# Patient Record
Sex: Male | Born: 1965 | Race: White | Hispanic: No | Marital: Single | Smoking: Former smoker
Health system: Southern US, Community
[De-identification: ages and names within clinical notes are randomized; demographics above are authoritative.]

## PROBLEM LIST (undated history)

## (undated) DIAGNOSIS — F329 Major depressive disorder, single episode, unspecified: Secondary | ICD-10-CM

## (undated) DIAGNOSIS — F32A Depression, unspecified: Secondary | ICD-10-CM

## (undated) DIAGNOSIS — N189 Chronic kidney disease, unspecified: Secondary | ICD-10-CM

## (undated) HISTORY — PX: OTHER SURGICAL HISTORY: SHX169

---

## 2011-08-26 ENCOUNTER — Ambulatory Visit: Payer: Self-pay | Admitting: Internal Medicine

## 2012-01-30 ENCOUNTER — Emergency Department: Payer: Self-pay | Admitting: Emergency Medicine

## 2012-01-30 LAB — CBC
HCT: 45.3 % (ref 40.0–52.0)
HGB: 15.3 g/dL (ref 13.0–18.0)
MCH: 30.4 pg (ref 26.0–34.0)
MCHC: 33.7 g/dL (ref 32.0–36.0)
MCV: 90 fL (ref 80–100)
Platelet: 305 10*3/uL (ref 150–440)
RBC: 5.02 10*6/uL (ref 4.40–5.90)
RDW: 13.4 % (ref 11.5–14.5)
WBC: 15.2 10*3/uL — ABNORMAL HIGH (ref 3.8–10.6)

## 2012-01-30 LAB — URINALYSIS, COMPLETE
Bacteria: NONE SEEN
Bilirubin,UR: NEGATIVE
Glucose,UR: NEGATIVE mg/dL (ref 0–75)
Leukocyte Esterase: NEGATIVE
Nitrite: NEGATIVE
Specific Gravity: 1.019 (ref 1.003–1.030)
Squamous Epithelial: NONE SEEN
WBC UR: 1 /HPF (ref 0–5)

## 2012-01-30 LAB — COMPREHENSIVE METABOLIC PANEL
Albumin: 3.8 g/dL (ref 3.4–5.0)
Alkaline Phosphatase: 76 U/L (ref 50–136)
BUN: 13 mg/dL (ref 7–18)
Calcium, Total: 9.1 mg/dL (ref 8.5–10.1)
Creatinine: 1.45 mg/dL — ABNORMAL HIGH (ref 0.60–1.30)
Glucose: 133 mg/dL — ABNORMAL HIGH (ref 65–99)
SGOT(AST): 25 U/L (ref 15–37)
SGPT (ALT): 36 U/L
Sodium: 140 mmol/L (ref 136–145)
Total Protein: 7.7 g/dL (ref 6.4–8.2)

## 2012-02-04 ENCOUNTER — Other Ambulatory Visit: Payer: Self-pay | Admitting: Urology

## 2012-02-09 ENCOUNTER — Encounter (HOSPITAL_COMMUNITY): Payer: Self-pay | Admitting: *Deleted

## 2012-02-09 NOTE — Pre-Procedure Instructions (Signed)
Patient instructed to bring blue folder, driver, insurance info, picture ID. Patient to follow instructions for laxative,no asa, ibuprofen, toradol 72 hours prior to litho, NPO after midnight,except meds with a sip. Patient to arrive at 0530 am.

## 2012-02-11 ENCOUNTER — Encounter (HOSPITAL_COMMUNITY): Payer: Self-pay | Admitting: Pharmacy Technician

## 2012-02-12 ENCOUNTER — Ambulatory Visit (HOSPITAL_COMMUNITY): Payer: BC Managed Care – PPO

## 2012-02-12 ENCOUNTER — Encounter (HOSPITAL_COMMUNITY): Payer: Self-pay

## 2012-02-12 ENCOUNTER — Encounter (HOSPITAL_COMMUNITY)
Admission: RE | Disposition: A | Discharge status: Home / Self Care | Payer: Self-pay | Source: Ambulatory Visit | Attending: Urology

## 2012-02-12 ENCOUNTER — Ambulatory Visit (HOSPITAL_COMMUNITY)
Admission: RE | Admit: 2012-02-12 | Discharge: 2012-02-12 | Disposition: A | Discharge status: Home / Self Care | Payer: BC Managed Care – PPO | Source: Ambulatory Visit | Attending: Urology | Admitting: Urology

## 2012-02-12 DIAGNOSIS — G43909 Migraine, unspecified, not intractable, without status migrainosus: Secondary | ICD-10-CM | POA: Insufficient documentation

## 2012-02-12 DIAGNOSIS — F329 Major depressive disorder, single episode, unspecified: Secondary | ICD-10-CM | POA: Insufficient documentation

## 2012-02-12 DIAGNOSIS — F3289 Other specified depressive episodes: Secondary | ICD-10-CM | POA: Insufficient documentation

## 2012-02-12 DIAGNOSIS — N201 Calculus of ureter: Secondary | ICD-10-CM | POA: Insufficient documentation

## 2012-02-12 HISTORY — DX: Depression, unspecified: F32.A

## 2012-02-12 HISTORY — DX: Chronic kidney disease, unspecified: N18.9

## 2012-02-12 HISTORY — DX: Major depressive disorder, single episode, unspecified: F32.9

## 2012-02-12 SURGERY — LITHOTRIPSY, ESWL
Anesthesia: LOCAL | Laterality: Left

## 2012-02-12 MED ORDER — OXYCODONE-ACETAMINOPHEN 5-325 MG PO TABS
ORAL_TABLET | ORAL | Status: AC
  Filled 2012-02-12: qty 1

## 2012-02-12 MED ORDER — OXYCODONE-ACETAMINOPHEN 5-325 MG PO TABS: 1.0000 | ORAL_TABLET | ORAL | Status: DC | PRN

## 2012-02-12 MED ORDER — CIPROFLOXACIN HCL 500 MG PO TABS
500.0000 mg | ORAL_TABLET | ORAL | Status: AC
Start: 2012-02-12 — End: 2012-02-12
  Administered 2012-02-12: 500 mg via ORAL

## 2012-02-12 MED ORDER — DIAZEPAM 5 MG PO TABS
ORAL_TABLET | ORAL | Status: AC
  Administered 2012-02-12: 10 mg via ORAL
  Filled 2012-02-12: qty 2

## 2012-02-12 MED ORDER — DIPHENHYDRAMINE HCL 25 MG PO CAPS
ORAL_CAPSULE | ORAL | Status: AC
  Administered 2012-02-12: 25 mg via ORAL
  Filled 2012-02-12: qty 1

## 2012-02-12 MED ORDER — SODIUM CHLORIDE 0.9 % IV SOLN
INTRAVENOUS | Status: DC
  Administered 2012-02-12: 07:00:00 via INTRAVENOUS

## 2012-02-12 MED ORDER — DIPHENHYDRAMINE HCL 25 MG PO CAPS
25.0000 mg | ORAL_CAPSULE | ORAL | Status: AC
  Administered 2012-02-12: 25 mg via ORAL

## 2012-02-12 MED ORDER — OXYCODONE-ACETAMINOPHEN 10-325 MG PO TABS: 1.0000 | ORAL_TABLET | ORAL | Status: AC | PRN

## 2012-02-12 MED ORDER — HYDROMORPHONE HCL PF 1 MG/ML IJ SOLN: 0.5000 mg | INTRAMUSCULAR | Status: DC | PRN

## 2012-02-12 MED ORDER — DIAZEPAM 5 MG PO TABS
10.0000 mg | ORAL_TABLET | ORAL | Status: AC
  Administered 2012-02-12: 10 mg via ORAL

## 2012-02-12 MED ORDER — CIPROFLOXACIN HCL 500 MG PO TABS
ORAL_TABLET | ORAL | Status: AC
  Administered 2012-02-12: 500 mg via ORAL
  Filled 2012-02-12: qty 1

## 2012-02-12 NOTE — Progress Notes (Signed)
To litho truck via w/c 

## 2012-02-12 NOTE — Discharge Instructions (Signed)
Lithotripsy Care After Refer to this sheet for the next few weeks. These discharge instructions provide you with general information on caring for yourself after you leave the hospital. Your caregiver may also give you specific instructions. Your treatment has been planned according to the most current medical practices available, but unavoidable complications sometimes occur. If you have any problems or questions after discharge, please call your caregiver. AFTER THE PROCEDURE   The recovery time will vary with the procedure done.   You will be taken to the recovery area. A nurse will watch and check your progress. Once you are awake, stable, and taking fluids well, you will be allowed to go home as long as there are no problems.   Your urine may have a red tinge for a few days after treatment. Blood loss is usually minimal.   You may have soreness in the back or flank area. This usually goes away after a few days. The procedure can cause blotches or bruises on the back where the pressure wave enters the skin. These marks usually cause only minimal discomfort and should disappear in a short time.   Stone fragments should begin to pass within 24 hours of treatment. However, a delayed passage is not unusual.   You may have pain, discomfort, and feel sick to your stomach (nauseous) when the crushed (pulverized) fragments of stone are passed down the tube from the kidney to the bladder. Stone fragments can pass soon after the procedure and may last for up to 4 to 8 weeks.   A small number of patients may have severe pain when stone fragments are not able to pass, which leads to an obstruction.   If your stone is greater than 1 inch/2.5 centimeters in diameter or if you have multiple stones that have a combined diameter greater than 1 inch/2.5 centimeters, you may require more than 1 treatment.   You must have someone drive you home.  HOME CARE INSTRUCTIONS   Rest at home until you feel your  energy improving.   Only take over-the-counter or prescription medicines for pain, discomfort, or fever as directed by your caregiver. Depending on the type of lithotripsy, you may need to take medicines (antibiotics) that kill germs and anti-inflammatory medicines for a few days.   Drink enough water and fluids to keep your urine clear or pale yellow. This helps "flush" your kidneys. It helps pass any remaining pieces of stone and prevents stones from coming back.   Most people can resume daily activities within 1 or 2 days after standard lithotripsy. It can take longer to recover from laser and percutaneous lithotripsy.   If the stones are in your urinary system, you may be asked to strain your urine at home to look for stones. Any stones that are found can be sent to a medical lab for examination.   Visit your caregiver for a follow-up appointment in a few weeks. Your doctor may remove your stent if you have one. Your caregiver will also check to see whether stone particles still remain.  SEEK MEDICAL CARE IF:   You have an oral temperature above 102 F (38.9 C).   Your pain is not relieved by medicine.   You have a lasting nauseous feeling.   You feel there is too much blood in the urine.   You develop persistent problems with frequent and/or painful urination that does not at least partially improve after 2 days following the procedure.   You have a congested   cough.   You feel lightheaded.   You develop a rash or any other signs that might suggest an allergic problem.   You develop any reaction or side effects to your medicine(s).  SEEK IMMEDIATE MEDICAL CARE IF:   You experience severe back and/or flank pain.   You see nothing but blood when you urinate.   You cannot pass any urine at all.   You have an oral temperature above 102 F (38.9 C), not controlled by medicine.   You develop shortness of breath, difficulty breathing, or chest pain.   You develop vomiting  that will not stop after 6 to 8 hours.   You have a fainting episode.  MAKE SURE YOU:   Understand these instructions.   Will watch your condition.   Will get help right away if you are not doing well or get worse.  Document Released: 12/07/2007 Document Revised: 11/06/2011 Document Reviewed: 12/07/2007 Exit[employer] Patient Information 2012 [employer]. 

## 2012-02-12 NOTE — H&P (Signed)
History of Present Illness     Mr. Logan Erickson is a 46 year old male patient of Lonie Peak seen for treatment of a left ureteral stone. The patient had reported passing about 2 stones per year and on a CT scan done on 01/26/12 a 5 mm stone was noted in the left proximal ureter. Of note no renal calculi were identified on that study. He started having left flank pain in 1/13. He's had intermittent pain in his been quite severe at times having necessitated a trip to the emergency room. He's not having any pain at this time. He did have some pain last night. He's not seen any hematuria and has no irritative voiding symptoms. His pain was not modified by positional change.  Past urologic history: In addition to the passage of kidney stones he also has had a vasectomy by Dr. Logan Bores in 6/07.   Past Medical History Problems  1. History of  Depression 311 2. History of  Hyperlipidemia 272.4 3. History of  Migraine Headache 346.90  Surgical History  Problems  1. History of  Closed Treatment Of Fracture Of A Finger Joint 2. History of  Pilonidal Cyst Resection  Current Meds 1. Atenolol 25 MG Oral Tablet; Therapy: (Recorded:06Mar2013) to 2. Percocet TABS; Therapy: (Recorded:06Mar2013) to 3. Tamsulosin HCl CAPS; Therapy: (Recorded:06Mar2013) to 4. Toradol SOLN; Therapy: (Recorded:06Mar2013) to 5. Venlafaxine HCl ER 75 MG Oral Capsule Extended Release 24 Hour; Therapy:  (Recorded:06Mar2013) to  Allergies Medication  1. Erythromycin TABS  Family History Problems  1. Paternal history of  Cholelithiasis 2. Maternal history of  Cholelithiasis 3. Sororal history of  Cholelithiasis 4. Family history of  Family Health Status - Father's Age 82 5. Family history of  Family Health Status - Mother's Age 98 6. Paternal uncle's history of  Kidney Cancer V16.51 7. Maternal history of  Migraine Headache 8. Maternal history of  Parkinson's Disease  Social History Problems    Alcohol Use 2-3 Beers  weekly   Caffeine Use 1-2 daily   Former Smoker V15.82 Smoked 1-2 pipes daily; Smoked for 1 year; Quit smoking 21 years ago; Denies any other forms of tobacco use   Marital History - Currently Married   Occupation: Education officer, environmental  Review of Systems Genitourinary, constitutional, skin, eye, otolaryngeal, hematologic/lymphatic, cardiovascular, pulmonary, endocrine, musculoskeletal, gastrointestinal, neurological and psychiatric system(s) were reviewed and pertinent findings if present are noted.  Genitourinary: urinary frequency, dysuria, nocturia, difficulty starting the urinary stream and hematuria.  Gastrointestinal: nausea, abdominal pain and constipation.  Constitutional: feeling tired (fatigue).  Musculoskeletal: back pain.  Neurological: headache and dizziness.  Psychiatric: depression.    Vitals Vital Signs  BMI Calculated: 32.06 BSA Calculated: 2.23 Height: 5 ft 11 in Weight: 229 lb  Blood Pressure: 123 / 82, RUE, Sitting Temperature: 99.8 F, Oral Heart Rate: 67  Physical Exam Constitutional: Well nourished and well developed . No acute distress.  ENT:. The ears and nose are normal in appearance.  Neck: The appearance of the neck is normal and no neck mass is present.  Pulmonary: No respiratory distress and normal respiratory rhythm and effort.  Cardiovascular: Heart rate and rhythm are normal . No peripheral edema.  Abdomen: The abdomen is soft and nontender. No masses are palpated. No CVA tenderness. No hernias are palpable. No hepatosplenomegaly noted.  Lymphatics: The femoral and inguinal nodes are not enlarged or tender.  Skin: Normal skin turgor, no visible rash and no visible skin lesions.  Neuro/Psych:. Mood and affect are appropriate.    Results/Data  Urine   06Mar2013  COLOR YELLOW   APPEARANCE CLOUDY   SPECIFIC GRAVITY 1.010   pH 5.5   GLUCOSE NEG mg/dL  BILIRUBIN NEG   KETONE NEG mg/dL  BLOOD LARGE   PROTEIN NEG mg/dL  UROBILINOGEN  0.2 mg/dL  NITRITE NEG   LEUKOCYTE ESTERASE NEG   SQUAMOUS EPITHELIAL/HPF NONE SEEN   WBC NONE SEEN WBC/hpf  RBC 21-50 RBC/hpf  BACTERIA NONE SEEN   CRYSTALS NONE SEEN   CASTS NONE SEEN    Old records or history reviewed: Notes from [facility] office as above.  The following clinical lab reports were reviewed:  I note his serum creatinine was 1.45 in 3/13.  The following radiology reports were reviewed: CT scan as above.    Assessment Assessed  1. Ureteral Stone Left 592.1   I obtained a KUB today and this reveals an irregular calcification in the distal left pelvis region overlying the course of the left ureter next a phlebolith. I told him that I could not be 100% sure that there was a stone but I was almost certain this was the stone that was previously seen in his proximal ureter about a week ago. He's been on medical expulsive therapy since that time and was pleased to see the stone had progressed but has been having intermittent pain. We therefore discussed the treatment options which would be continued observation with medical expulsive therapy, ureteroscopy and lithotripsy. Gone over each of these options, their pluses and minuses in the risks and complications associated. What is elected to do is proceed with lithotripsy in the next week or so and if he passes his stone in the interim, since it has progressed significantly since his CT scan, he will contact me and we'll cancel the surgery otherwise I will plan to proceed with lithotripsy of his left distal ureteral stone.   Plan   1. Continue medical expulsive therapy. 2. He is scheduled for lithotripsy of his left distal ureteral stone.

## 2012-02-12 NOTE — Op Note (Signed)
See [facility] OP note scanned into chart. 

## 2012-02-13 ENCOUNTER — Encounter (HOSPITAL_COMMUNITY): Payer: Self-pay

## 2020-01-20 ENCOUNTER — Ambulatory Visit: Payer: BC Managed Care – PPO | Attending: Internal Medicine

## 2020-01-20 DIAGNOSIS — Z23 Encounter for immunization: Secondary | ICD-10-CM

## 2020-01-20 NOTE — Progress Notes (Signed)
   Covid-19 Vaccination Clinic  Name:  [name]    MRN: [id number] DOB: 09-01-1966  01/20/2020  [name] was observed post Covid-19 immunization for 15 minutes without incidence. He was provided with Vaccine Information Sheet and instruction to access the V-Safe system.   [name] was instructed to call 911 with any severe reactions post vaccine: Marland Kitchen Difficulty breathing  . Swelling of your face and throat  . A fast heartbeat  . A bad rash all over your body  . Dizziness and weakness    Immunizations Administered    Name Date Dose VIS Date Route   Pfizer COVID-19 Vaccine 01/20/2020  5:16 PM 0.3 mL 11/11/2019 Intramuscular   Manufacturer: [employer], [employer]   Lot: [id number]   NDC: [address]-5

## 2020-02-14 ENCOUNTER — Ambulatory Visit: Payer: BC Managed Care – PPO | Attending: Internal Medicine

## 2020-02-14 DIAGNOSIS — Z23 Encounter for immunization: Secondary | ICD-10-CM

## 2020-02-14 NOTE — Progress Notes (Signed)
   Covid-19 Vaccination Clinic  Name:  GEDALIA MCMILLON    MRN: [id number] DOB: May 17, 1966  02/14/2020  Mr. Lundblad was observed post Covid-19 immunization for 15 minutes without incident. He was provided with Vaccine Information Sheet and instruction to access the V-Safe system.   Mr. Weare was instructed to call 911 with any severe reactions post vaccine: Marland Kitchen Difficulty breathing  . Swelling of face and throat  . A fast heartbeat  . A bad rash all over body  . Dizziness and weakness   Immunizations Administered    Name Date Dose VIS Date Route   Pfizer COVID-19 Vaccine 02/14/2020  8:35 AM 0.3 mL 11/11/2019 Intramuscular   Manufacturer: [employer], [employer]   Lot: [id number]   NDC: [address]-8

## 2022-01-23 ENCOUNTER — Other Ambulatory Visit: Payer: Self-pay

## 2022-01-23 ENCOUNTER — Ambulatory Visit
Admission: RE | Admit: 2022-01-23 | Discharge: 2022-01-23 | Disposition: A | Discharge status: Home / Self Care | Payer: BC Managed Care – PPO | Attending: Urology | Admitting: Urology

## 2022-01-23 ENCOUNTER — Encounter: Payer: Self-pay | Admitting: Urology

## 2022-01-23 ENCOUNTER — Ambulatory Visit
Admission: RE | Admit: 2022-01-23 | Discharge: 2022-01-23 | Disposition: A | Discharge status: Home / Self Care | Payer: BC Managed Care – PPO | Source: Ambulatory Visit | Attending: Urology | Admitting: Urology

## 2022-01-23 ENCOUNTER — Ambulatory Visit: Payer: BC Managed Care – PPO | Admitting: Urology

## 2022-01-23 VITALS — BP 146/92 | HR 130 | Ht 69.0 in | Wt 239.0 lb

## 2022-01-23 DIAGNOSIS — N201 Calculus of ureter: Secondary | ICD-10-CM

## 2022-01-23 DIAGNOSIS — N23 Unspecified renal colic: Secondary | ICD-10-CM

## 2022-01-23 DIAGNOSIS — N200 Calculus of kidney: Secondary | ICD-10-CM

## 2022-01-23 LAB — URINALYSIS, COMPLETE
Bilirubin, UA: NEGATIVE
Glucose, UA: NEGATIVE
Ketones, UA: NEGATIVE
Nitrite, UA: NEGATIVE
Protein,UA: NEGATIVE
Specific Gravity, UA: 1.02 (ref 1.005–1.030)
Urobilinogen, Ur: 0.2 mg/dL (ref 0.2–1.0)
pH, UA: 5.5 (ref 5.0–7.5)

## 2022-01-23 LAB — MICROSCOPIC EXAMINATION
Bacteria, UA: NONE SEEN
Epithelial Cells (non renal): NONE SEEN /hpf (ref 0–10)

## 2022-01-23 NOTE — Progress Notes (Signed)
01/23/2022 2:40 PM   Logan Erickson June 05, 1966 [id number]  Referring provider: Lonie Peak, PA-C [address] [place],  [place] [address]  Chief Complaint  Patient presents with   Nephrolithiasis    HPI: Logan Erickson is a 56 y.o. male referred for evaluation of left flank pain.  2 week history of intermittent left flank pain.  At times pain severe rated 9-9.5/10 and associated with nausea/vomiting.  Urine initially brownish.  Saw PCP and UA showed 3-5 WBC/5-10 RBC.  Urine culture was negative. Started on hydrocodone, ketorolac and tamsulosin Pain minimal at present No fever or chills No imaging yet performed Current symptoms identical to previous stone in 2013.  He had a 6 mm left proximal ureteral calculus and was treated with SWL by Dr. Vernie Ammons in [place] States he passes 1-2 small stones per year  PMH: Past Medical History:  Diagnosis Date   Depression    kidney stone    Migraine     Surgical History: Past Surgical History:  Procedure Laterality Date   reconsturctive surgery right thumb    Shockwave lithotripsy  Home Medications:  Allergies as of 01/23/2022       Reactions   Ciprofloxacin Other (See Comments)   tendinitis   Erythromycin Rash        Medication List        Accurate as of January 23, 2022  2:40 PM. If you have any questions, ask your nurse or doctor.          STOP taking these medications    atenolol 25 MG tablet Commonly known as: TENORMIN Stopped by: Riki Altes, MD   ketorolac 10 MG tablet Commonly known as: TORADOL Stopped by: Riki Altes, MD   latanoprost 0.005 % ophthalmic solution Commonly known as: XALATAN Stopped by: Riki Altes, MD   oxyCODONE-acetaminophen 5-325 MG tablet Commonly known as: PERCOCET/ROXICET Stopped by: Riki Altes, MD       TAKE these medications    buPROPion 150 MG 24 hr tablet Commonly known as: WELLBUTRIN XL Take 150 mg by mouth every morning.    LORazepam 0.5 MG tablet Commonly known as: ATIVAN Take 0.5 mg by mouth 2 (two) times daily as needed.   metoprolol succinate 100 MG 24 hr tablet Commonly known as: TOPROL-XL Take 1 tablet by mouth daily.   tamsulosin 0.4 MG Caps capsule Commonly known as: FLOMAX Take 0.4 mg by mouth daily after breakfast.   venlafaxine 75 MG tablet Commonly known as: EFFEXOR Take by mouth.        Allergies:  Allergies  Allergen Reactions   Ciprofloxacin Other (See Comments)    tendinitis   Erythromycin Rash    Family History: History reviewed. No pertinent family history.  Social History:  reports that he quit smoking about 32 years ago. His smoking use included cigarettes. He has a 0.25 pack-year smoking history. He does not have any smokeless tobacco history on file. He reports current alcohol use. He reports that he does not use drugs.   Physical Exam: BP (!) 146/92    Pulse (!) 130    Ht 5\' 9"  (1.753 m)    Wt 239 lb (108.4 kg)    BMI 35.29 kg/m   Constitutional:  Alert and oriented, No acute distress. HEENT: [place] AT, moist mucus membranes.  Trachea midline, no masses. Skin: No rashes, bruises or suspicious lesions. Neurologic: Grossly intact, no focal deficits, moving all 4 extremities. Psychiatric: Normal mood and affect.  Laboratory Data:  Urinalysis Dipstick trace blood/trace leukocytes/microscopy negative   Assessment & Plan:    1.  Renal colic Symptoms consistent with recurrent ureteral calculus KUB ordered today and he will be notified with results If a definite stone is not visualized will order stone protocol CT Continue tamsulosin and oral analgesics as needed  I contacted Logan Erickson with his KUB results.  He does have a 9 mm faint calcification in the region of the left proximal ureter He has had prior successful lithotripsy and the stone would be amenable to SWL.  Ureteroscopic removal was also discussed SWL has a lower stone free rate in a single procedure,  but also a lower complication rate compared to ureteroscopy and avoids a stent and associated stent related symptoms. Possible complications include renal hematoma, steinstrasse, and need for additional treatment. Ureteroscopy with laser lithotripsy and stent placement has a higher stone free rate than SWL in a single procedure, however increased complication rate including possible infection, ureteral injury, bleeding, and stent related morbidity. Common stent related symptoms include dysuria, urgency/frequency, and flank pain. He would like to schedule SWL next week Schedule RUS to verify   Abbie Sons, MD  [facility] [address], [place], [place] [address] [phone]

## 2022-01-23 NOTE — H&P (View-Only) (Signed)
° °01/23/2022 °2:40 PM  ° °Logan Erickson °06/26/1966 °[id number] ° °Referring provider: Conroy, Nathan, PA-C °[address] °[place],  [place] [address] ° °Chief Complaint  °Patient presents with  ° Nephrolithiasis  ° ° °HPI: °Logan Erickson is a 55 y.o. male referred for evaluation of left flank pain. ° °2 week history of intermittent left flank pain.  At times pain severe rated 9-9.5/10 and associated with nausea/vomiting.  Urine initially brownish.  Saw PCP and UA showed 3-5 WBC/5-10 RBC.  Urine culture was negative. °Started on hydrocodone, ketorolac and tamsulosin °Pain minimal at present °No fever or chills °No imaging yet performed °Current symptoms identical to previous stone in 2013.  He had a 6 mm left proximal ureteral calculus and was treated with SWL by Dr. Ottelin in [place] °States he passes 1-2 small stones per year ° °PMH: °Past Medical History:  °Diagnosis Date  ° Depression   ° kidney stone   ° Migraine   ° ° °Surgical History: °Past Surgical History:  °Procedure Laterality Date  ° reconsturctive surgery right thumb    °Shockwave lithotripsy ° °Home Medications:  °Allergies as of 01/23/2022   ° °   Reactions  ° Ciprofloxacin Other (See Comments)  ° tendinitis  ° Erythromycin Rash  ° °  ° °  °Medication List  °  ° °  ° Accurate as of January 23, 2022  2:40 PM. If you have any questions, ask your nurse or doctor.  °  °  ° °  ° °STOP taking these medications   ° °atenolol 25 MG tablet °Commonly known as: TENORMIN °Stopped by: Finch Costanzo C Tanita Palinkas, MD °  °ketorolac 10 MG tablet °Commonly known as: TORADOL °Stopped by: Maziyah Vessel C Ariq Khamis, MD °  °latanoprost 0.005 % ophthalmic solution °Commonly known as: XALATAN °Stopped by: Zamiyah Resendes C Romulo Okray, MD °  °oxyCODONE-acetaminophen 5-325 MG tablet °Commonly known as: PERCOCET/ROXICET °Stopped by: Ilda Laskin C Dennis Killilea, MD °  ° °  ° °TAKE these medications   ° °buPROPion 150 MG 24 hr tablet °Commonly known as: WELLBUTRIN XL °Take 150 mg by mouth every morning. °   °LORazepam 0.5 MG tablet °Commonly known as: ATIVAN °Take 0.5 mg by mouth 2 (two) times daily as needed. °  °metoprolol succinate 100 MG 24 hr tablet °Commonly known as: TOPROL-XL °Take 1 tablet by mouth daily. °  °tamsulosin 0.4 MG Caps capsule °Commonly known as: FLOMAX °Take 0.4 mg by mouth daily after breakfast. °  °venlafaxine 75 MG tablet °Commonly known as: EFFEXOR °Take by mouth. °  ° °  ° ° °Allergies:  °Allergies  °Allergen Reactions  ° Ciprofloxacin Other (See Comments)  °  tendinitis  ° Erythromycin Rash  ° ° °Family History: °History reviewed. No pertinent family history. ° °Social History:  reports that he quit smoking about 32 years ago. His smoking use included cigarettes. He has a 0.25 pack-year smoking history. He does not have any smokeless tobacco history on file. He reports current alcohol use. He reports that he does not use drugs. ° ° °Physical Exam: °BP (!) 146/92    Pulse (!) 130    Ht 5' 9" (1.753 m)    Wt 239 lb (108.4 kg)    BMI 35.29 kg/m²   °Constitutional:  Alert and oriented, No acute distress. °HEENT: [place] AT, moist mucus membranes.  Trachea midline, no masses. °Skin: No rashes, bruises or suspicious lesions. °Neurologic: Grossly intact, no focal deficits, moving all 4 extremities. °Psychiatric: Normal mood and affect. ° °  Laboratory Data:  Urinalysis Dipstick trace blood/trace leukocytes/microscopy negative   Assessment & Plan:    1.  Renal colic Symptoms consistent with recurrent ureteral calculus KUB ordered today and he will be notified with results If a definite stone is not visualized will order stone protocol CT Continue tamsulosin and oral analgesics as needed  I contacted Mr. Basista with his KUB results.  He does have a 9 mm faint calcification in the region of the left proximal ureter He has had prior successful lithotripsy and the stone would be amenable to SWL.  Ureteroscopic removal was also discussed SWL has a lower stone free rate in a single procedure,  but also a lower complication rate compared to ureteroscopy and avoids a stent and associated stent related symptoms. Possible complications include renal hematoma, steinstrasse, and need for additional treatment. Ureteroscopy with laser lithotripsy and stent placement has a higher stone free rate than SWL in a single procedure, however increased complication rate including possible infection, ureteral injury, bleeding, and stent related morbidity. Common stent related symptoms include dysuria, urgency/frequency, and flank pain. He would like to schedule SWL next week Schedule RUS to verify   Abbie Sons, MD  [facility] [address], [place], [place] [address] [phone]

## 2022-01-24 ENCOUNTER — Other Ambulatory Visit: Payer: Self-pay | Admitting: Urology

## 2022-01-24 DIAGNOSIS — N201 Calculus of ureter: Secondary | ICD-10-CM

## 2022-01-24 DIAGNOSIS — N23 Unspecified renal colic: Secondary | ICD-10-CM

## 2022-01-24 NOTE — Progress Notes (Signed)
ESWL ORDER FORM  Expected date of procedure: 01/30/2022   Surgeon:  Treating  Post op standing: 2-4wk follow up w/KUB prior  Anticoagulation/Aspirin/NSAID standing order: Hold all 72 hours prior  Anesthesia standing order: MAC  VTE standing: SCD's  Dx: Left Ureteral Stone  Procedure: left Extracorporeal shock wave lithotripsy  CPT : 01093  Standing Order Set:   *NPO after mn, KUB  *NS 186m/hr, Keflex 5022mPO, Benadryl 2551mO, Valium 26m3m, Zofran 4mg 15m   Medications if other than standing orders:   NONE  Needs renal ultrasound or CT prior to procedure.  I put an ultrasound order but if CT can be scheduled earlier I will change.  MicheSharyn Lullhelp getting it scheduled.  Thanks

## 2022-01-27 ENCOUNTER — Telehealth: Payer: Self-pay | Admitting: *Deleted

## 2022-01-27 ENCOUNTER — Ambulatory Visit
Admission: RE | Admit: 2022-01-27 | Discharge: 2022-01-27 | Disposition: A | Discharge status: Home / Self Care | Payer: BC Managed Care – PPO | Source: Ambulatory Visit | Attending: Urology | Admitting: Urology

## 2022-01-27 ENCOUNTER — Other Ambulatory Visit: Payer: Self-pay

## 2022-01-27 DIAGNOSIS — N23 Unspecified renal colic: Secondary | ICD-10-CM | POA: Diagnosis not present

## 2022-01-27 DIAGNOSIS — N201 Calculus of ureter: Secondary | ICD-10-CM | POA: Diagnosis present

## 2022-01-27 MED ORDER — HYDROCODONE-ACETAMINOPHEN 5-325 MG PO TABS: 1.0000 | ORAL_TABLET | Freq: Four times a day (QID) | ORAL | 0 refills | Status: DC | PRN

## 2022-01-27 NOTE — Addendum Note (Signed)
Addended by: Abbie Sons on: 01/27/2022 12:44 PM   Modules accepted: Orders

## 2022-01-27 NOTE — Telephone Encounter (Signed)
Patient called in today and states he has been having a lot of pain this weekend and he is out of pain medication .

## 2022-01-27 NOTE — Telephone Encounter (Signed)
Rx sent 

## 2022-01-28 ENCOUNTER — Ambulatory Visit: Payer: Self-pay | Admitting: Urology

## 2022-01-28 NOTE — Addendum Note (Signed)
Addended by: Letta Kocher A on: 01/28/2022 09:25 AM   Modules accepted: Orders

## 2022-01-29 MED ORDER — DIPHENHYDRAMINE HCL 25 MG PO CAPS: 25.0000 mg | ORAL_CAPSULE | ORAL | Status: AC

## 2022-01-29 MED ORDER — SODIUM CHLORIDE 0.9 % IV SOLN: INTRAVENOUS | Status: DC

## 2022-01-29 MED ORDER — DIAZEPAM 5 MG PO TABS: 10.0000 mg | ORAL_TABLET | ORAL | Status: AC

## 2022-01-29 MED ORDER — CEPHALEXIN 500 MG PO CAPS: 500.0000 mg | ORAL_CAPSULE | Freq: Once | ORAL | Status: AC

## 2022-01-29 MED ORDER — ONDANSETRON HCL 4 MG/2ML IJ SOLN: 4.0000 mg | Freq: Once | INTRAMUSCULAR | Status: AC

## 2022-01-30 ENCOUNTER — Ambulatory Visit
Admission: RE | Admit: 2022-01-30 | Discharge: 2022-01-30 | Disposition: A | Discharge status: Home / Self Care | Payer: BC Managed Care – PPO | Attending: Urology | Admitting: Urology

## 2022-01-30 ENCOUNTER — Other Ambulatory Visit: Payer: Self-pay

## 2022-01-30 ENCOUNTER — Ambulatory Visit: Payer: BC Managed Care – PPO

## 2022-01-30 ENCOUNTER — Encounter: Payer: Self-pay | Admitting: Urology

## 2022-01-30 ENCOUNTER — Encounter
Admission: RE | Disposition: A | Discharge status: Home / Self Care | Payer: Self-pay | Source: Home / Self Care | Attending: Urology

## 2022-01-30 DIAGNOSIS — Z87891 Personal history of nicotine dependence: Secondary | ICD-10-CM | POA: Insufficient documentation

## 2022-01-30 DIAGNOSIS — N23 Unspecified renal colic: Secondary | ICD-10-CM

## 2022-01-30 DIAGNOSIS — N201 Calculus of ureter: Secondary | ICD-10-CM | POA: Insufficient documentation

## 2022-01-30 HISTORY — PX: EXTRACORPOREAL SHOCK WAVE LITHOTRIPSY: SHX1557

## 2022-01-30 SURGERY — LITHOTRIPSY, ESWL
Anesthesia: Moderate Sedation | Laterality: Left

## 2022-01-30 MED ORDER — DIAZEPAM 5 MG PO TABS
ORAL_TABLET | ORAL | Status: AC
  Administered 2022-01-30: 10 mg via ORAL
  Filled 2022-01-30: qty 2

## 2022-01-30 MED ORDER — ONDANSETRON HCL 4 MG/2ML IJ SOLN
INTRAMUSCULAR | Status: AC
  Administered 2022-01-30: 4 mg via INTRAVENOUS
  Filled 2022-01-30: qty 2

## 2022-01-30 MED ORDER — DIPHENHYDRAMINE HCL 25 MG PO CAPS
ORAL_CAPSULE | ORAL | Status: AC
  Administered 2022-01-30: 25 mg via ORAL
  Filled 2022-01-30: qty 1

## 2022-01-30 MED ORDER — CEPHALEXIN 500 MG PO CAPS
ORAL_CAPSULE | ORAL | Status: AC
  Administered 2022-01-30: 500 mg via ORAL
  Filled 2022-01-30: qty 1

## 2022-01-30 MED ORDER — KETOROLAC TROMETHAMINE 30 MG/ML IJ SOLN
INTRAMUSCULAR | Status: AC
  Filled 2022-01-30: qty 1

## 2022-01-30 MED ORDER — KETOROLAC TROMETHAMINE 15 MG/ML IJ SOLN
15.0000 mg | Freq: Once | INTRAMUSCULAR | Status: AC
  Administered 2022-01-30: 15 mg via INTRAVENOUS

## 2022-01-30 MED ORDER — KETOROLAC TROMETHAMINE 30 MG/ML IJ SOLN: 30.0000 mg | Freq: Once | INTRAMUSCULAR | Status: DC

## 2022-01-30 NOTE — Brief Op Note (Signed)
01/30/2022 ? ?9:12 AM ? ?PATIENT:  Logan Erickson  56 y.o. male ? ?PRE-OPERATIVE DIAGNOSIS:  Left 11mm mid ureteral stone ? ?POST-OPERATIVE DIAGNOSIS:  Same ? ?PROCEDURE:  Procedure(s): ?EXTRACORPOREAL SHOCK WAVE LITHOTRIPSY (ESWL) (Left) ? ?SURGEON:  Surgeon(s) and Role: ?   * Qadir Folks, Laurette Schimke, MD - Primary ? ?ANESTHESIA: Conscious Sedation ? ?EBL:  None ? ?Drains: None ? ?Specimen: None ? ?Findings:  ?Stone appeared to smudge, tolerated SWL well ? ?DISPO: ?Flomax, pain meds PRN, RTC 2 weeks KUB ? ?Legrand Rams, MD ?01/30/2022 ? ? ? ? ?

## 2022-01-30 NOTE — Interval H&P Note (Signed)
UROLOGY H&P UPDATE ? ?Agree with prior H&P dated 01/23/22 by Dr Lonna Cobb. 59mm left proximal ureteral stone, ongoing renal colic. ? ?Cardiac: RRR ?Lungs: CTA bilaterally ? ?Laterality: LEFT ?Procedure: LEFT Shockwave lithotripsy ? ?Urine: UA 2/23 benign ? ?Informed consent obtained, we specifically discussed the risks of bleeding/hematoma, infection, post-operative pain, obstructive fragment, steinstrasse, need for additional procedures. ? ?Sondra Come, MD ?01/30/2022 ? ? ? ?

## 2022-01-30 NOTE — Discharge Instructions (Signed)
AMBULATORY SURGERY  ?DISCHARGE INSTRUCTIONS ? ? ?The drugs that you were given will stay in your system until tomorrow so for the next 24 hours you should not: ? ?Drive an automobile ?Make any legal decisions ?Drink any alcoholic beverage ? ? ?You may resume regular meals tomorrow.  Today it is better to start with liquids and gradually work up to solid foods. ? ?You may eat anything you prefer, but it is better to start with liquids, then soup and crackers, and gradually work up to solid foods. ? ? ?Please notify your doctor immediately if you have any unusual bleeding, trouble breathing, redness and pain at the surgery site, drainage, fever, or pain not relieved by medication. ? ? ? ?Additional Instructions: ? ? ? ?Please contact your physician with any problems or Same Day Surgery at [phone], Monday through Friday 6 am to 4 pm, or [place] [facility] [place] [facility] number at [phone].  ?

## 2022-01-31 ENCOUNTER — Other Ambulatory Visit: Payer: Self-pay | Admitting: Urology

## 2022-01-31 ENCOUNTER — Encounter: Payer: Self-pay | Admitting: Urology

## 2022-01-31 ENCOUNTER — Other Ambulatory Visit: Payer: Self-pay

## 2022-01-31 DIAGNOSIS — N201 Calculus of ureter: Secondary | ICD-10-CM

## 2022-02-03 NOTE — Telephone Encounter (Signed)
Pt called triage line and requests refill on Hydrocodone, please advise.  ?

## 2022-02-18 ENCOUNTER — Other Ambulatory Visit: Payer: Self-pay | Admitting: Urology

## 2022-02-18 ENCOUNTER — Encounter: Payer: Self-pay | Admitting: Urology

## 2022-02-18 NOTE — Telephone Encounter (Signed)
Patient Comment: Fine until 3/19 when pain in lower left abdomen returned.  Passed two 15mm bits on 3/20. Today pain is worse? 7/10 on pain scale. ?

## 2022-02-26 NOTE — Progress Notes (Incomplete)
02/26/22 ?2:24 PM  ? ?Sheppard Penton Chernick ?[birth year] ?[id number] ? ?Referring provider:  ?Lonie Peak, PA-C ?[address] [place] ?[place],  [place] [address] ?No chief complaint on file. ? ? ?Urological history: ?Ureteral calculus  ?- KUB on 01/23/2022 visualized a 9 mm faint calcification in the region of the left proximal ureter ?- He is s/p ESWL on 01/30/2022 with Dr.Sninsky ? ?HPI: ?HECTOR TAFT is a 56 y.o.male who presents today for a post-op follow-up with KUB.  ? ? ? ? ? ?PMH: ?Past Medical History:  ?Diagnosis Date  ? Depression   ? kidney stone   ? Migraine   ? ? ?Surgical History: ?Past Surgical History:  ?Procedure Laterality Date  ? EXTRACORPOREAL SHOCK WAVE LITHOTRIPSY Left 01/30/2022  ? Procedure: EXTRACORPOREAL SHOCK WAVE LITHOTRIPSY (ESWL);  Surgeon: Sondra Come, MD;  Location: [facility] ORS;  Service: Urology;  Laterality: Left;  ? reconsturctive surgery right thumb    ? ? ?Home Medications:  ?Allergies as of 02/27/2022   ? ?   Reactions  ? Ciprofloxacin Other (See Comments)  ? tendinitis  ? Erythromycin Rash  ? ?  ? ?  ?Medication List  ?  ? ?  ? Accurate as of February 26, 2022  2:24 PM. If you have any questions, ask your nurse or doctor.  ?  ?  ? ?  ? ?buPROPion 150 MG 24 hr tablet ?Commonly known as: WELLBUTRIN XL ?Take 150 mg by mouth every morning. ?  ?HYDROcodone-acetaminophen 5-325 MG tablet ?Commonly known as: NORCO/VICODIN ?TAKE 1 TABLET BY MOUTH EVERY 6 HOURS AS NEEDED FOR MODERATE PAIN ?  ?LORazepam 0.5 MG tablet ?Commonly known as: ATIVAN ?Take 0.5 mg by mouth 2 (two) times daily as needed. ?  ?metoprolol succinate 100 MG 24 hr tablet ?Commonly known as: TOPROL-XL ?Take 1 tablet by mouth daily. ?  ?tamsulosin 0.4 MG Caps capsule ?Commonly known as: FLOMAX ?Take 0.4 mg by mouth daily after breakfast. ?  ?venlafaxine 75 MG tablet ?Commonly known as: EFFEXOR ?Take by mouth. ?  ? ?  ? ? ?Allergies:  ?Allergies  ?Allergen Reactions  ? Ciprofloxacin Other (See Comments)  ?  tendinitis  ?  Erythromycin Rash  ? ? ?Family History: ?No family history on file. ? ?Social History:  reports that he quit smoking about 33 years ago. His smoking use included cigarettes. He has a 0.25 pack-year smoking history. He does not have any smokeless tobacco history on file. He reports current alcohol use. He reports that he does not use drugs. ? ? ?Physical Exam: ?There were no vitals taken for this visit.  ?Constitutional:  Alert and oriented, No acute distress. ?HEENT: [place] AT, moist mucus membranes.  Trachea midline, no masses. ?Cardiovascular: No clubbing, cyanosis, or edema. ?Respiratory: Normal respiratory effort, no increased work of breathing. ?Skin: No rashes, bruises or suspicious lesions. ?Neurologic: Grossly intact, no focal deficits, moving all 4 extremities. ?Psychiatric: Normal mood and affect. ? ? ?Laboratory Data: ?Lab Results  ?Component Value Date  ? CREATININE 1.45 (H) 01/30/2012  ? ? ?Urinalysis ? ? ?Pertinent Imaging: ? ? ?Assessment & Plan:   ? ? ?No follow-ups on file. ? ?[facility] ?[address], [address] ?[place], [place] [address] ?[phone] ? ?I,Kailey Littlejohn,acting as a Neurosurgeon for [employer], PA-C.,have documented all relevant documentation on the behalf of SHANNON MCGOWAN, PA-C,as directed by  [facility], PA-C while in the presence of SHANNON MCGOWAN, PA-C. ?

## 2022-02-27 ENCOUNTER — Ambulatory Visit: Payer: BC Managed Care – PPO | Admitting: Urology

## 2022-03-09 NOTE — Progress Notes (Signed)
03/21/22 ?9:45 AM  ? ?Logan Erickson ?07-Nov-1966 ?[id number] ? ?Referring provider:  ?Lonie Peak, PA-C ?[address] [place] ?[place],  [place] [address] ? ?Chief Complaint  ?Patient presents with  ? Follow-up  ? Nephrolithiasis  ? ? ?Urological history: ?1. Nephrolithiasis ?-ESWL of a left ureteral stone in 2013 ?-History of spontaneous passage of stones 1-2 small stones a year ?-CT renal stone study on 01/27/2022 visualized a 4 mm calculus in the proximal left ureter with very mild upstream hydronephrosis. No right renal calculi or hydronephrosis. No suspicious mass lesion. Urinary bladder is unremarkable. ?-s/p ESWL on 01/30/2022 with Dr Richardo Hanks. ? ? ?HPI: ?Logan Erickson is a 56 y.o.male who presents today for post-op follow-up.  ? ?He had interval passage of stone fragments. He reports that he has pressure but otherwise his has not felt any stone pain in about a week. He reports that he has not undergone imagine after is ESWL because of the cost.  ? ?Patient denies any modifying or aggravating factors.  Patient denies any gross hematuria, dysuria or suprapubic/flank pain.  Patient denies any fevers, chills, nausea or vomiting.  ? ?PMH: ?Past Medical History:  ?Diagnosis Date  ? Depression   ? kidney stone   ? Migraine   ? ? ?Surgical History: ?Past Surgical History:  ?Procedure Laterality Date  ? EXTRACORPOREAL SHOCK WAVE LITHOTRIPSY Left 01/30/2022  ? Procedure: EXTRACORPOREAL SHOCK WAVE LITHOTRIPSY (ESWL);  Surgeon: Sondra Come, MD;  Location: [facility] ORS;  Service: Urology;  Laterality: Left;  ? reconsturctive surgery right thumb    ? ? ?Home Medications:  ?Allergies as of 03/10/2022   ? ?   Reactions  ? Ciprofloxacin Other (See Comments)  ? tendinitis  ? Erythromycin Rash  ? ?  ? ?  ?Medication List  ?  ? ?  ? Accurate as of March 10, 2022 11:59 PM. If you have any questions, ask your nurse or doctor.  ?  ?  ? ?  ? ?buPROPion 150 MG 24 hr tablet ?Commonly known as: WELLBUTRIN XL ?Take 150 mg by mouth every  morning. ?  ?butalbital-acetaminophen-caffeine 50-325-40 MG tablet ?Commonly known as: FIORICET ?Take by mouth. ?  ?HYDROcodone-acetaminophen 5-325 MG tablet ?Commonly known as: NORCO/VICODIN ?TAKE 1 TABLET BY MOUTH EVERY 6 HOURS AS NEEDED FOR MODERATE PAIN ?  ?LORazepam 0.5 MG tablet ?Commonly known as: ATIVAN ?Take 0.5 mg by mouth 2 (two) times daily as needed. ?  ?metoprolol succinate 100 MG 24 hr tablet ?Commonly known as: TOPROL-XL ?Take 1 tablet by mouth daily. ?  ?tamsulosin 0.4 MG Caps capsule ?Commonly known as: FLOMAX ?Take 0.4 mg by mouth daily after breakfast. ?  ?venlafaxine 75 MG tablet ?Commonly known as: EFFEXOR ?Take by mouth. ?  ? ?  ? ? ?Allergies:  ?Allergies  ?Allergen Reactions  ? Ciprofloxacin Other (See Comments)  ?  tendinitis  ? Erythromycin Rash  ? ? ?Family History: ?No family history on file. ? ?Social History:  reports that he quit smoking about 33 years ago. His smoking use included cigarettes. He has a 0.25 pack-year smoking history. He has been exposed to tobacco smoke. He has never used smokeless tobacco. He reports current alcohol use. He reports that he does not use drugs. ? ? ?Physical Exam: ?BP 121/75   Pulse 81   Ht 5\' 11"  (1.803 m)   Wt 258 lb (117 kg)   BMI 35.98 kg/m?   ?Constitutional:  Alert and oriented, No acute distress. ?HEENT: [place] AT, moist mucus membranes.  Trachea midline, no masses. ?Cardiovascular: No clubbing, cyanosis, or edema. ?Respiratory: Normal respiratory effort, no increased work of breathing. ?Neurologic: Grossly intact, no focal deficits, moving all 4 extremities. ?Psychiatric: Normal mood and affect. ? ?Laboratory data: ?Stone sent for analysis ? ?Pertinent imaging: ?CLINICAL DATA:  Preoperative lithotripsy ?  ?EXAM: ?ABDOMEN - 1 VIEW ?  ?COMPARISON:  01/23/2022 ?  ?FINDINGS: ?Similar position of proximal left ureteral calculus. No other ?calculi identified. Bowel gas pattern is unremarkable. ?  ?IMPRESSION: ?Similar position of proximal left  ureteral calculus. ?  ?  ?Electronically Signed ?  By: Guadlupe Spanish M.D. ?  On: 01/30/2022 08:34 ? ?He deferred follow-up KUB due to finances ? ?Assessment & Plan:   ?Left proximal ureteral stone  ?- S/p ESWL 01/30/2022  ?- Had interval passage of stone fragments ?- Will send stone fragments for analysis  ?- Advised of the risk of silent hydronephrosis ?- He continues to decline KUB due to the cost.  ? ?Return if symptoms worsen or fail to improve. ? ?[facility] ?[address] ?[place], [place] [address] ?[phone] ? ?I, Kailey Littlejohn,acting as a scribe for [employer], PA-C.,have documented all relevant documentation on the behalf of Carlesha Seiple, PA-C,as directed by  [facility], PA-C while in the presence of Renn Stille, PA-C. ?

## 2022-03-10 ENCOUNTER — Encounter: Payer: Self-pay | Admitting: Urology

## 2022-03-10 ENCOUNTER — Ambulatory Visit (INDEPENDENT_AMBULATORY_CARE_PROVIDER_SITE_OTHER): Payer: BC Managed Care – PPO | Admitting: Urology

## 2022-03-10 ENCOUNTER — Other Ambulatory Visit
Admission: RE | Admit: 2022-03-10 | Discharge: 2022-03-10 | Disposition: A | Discharge status: Home / Self Care | Payer: BC Managed Care – PPO | Attending: Urology | Admitting: Urology

## 2022-03-10 VITALS — BP 121/75 | HR 81 | Ht 71.0 in | Wt 258.0 lb

## 2022-03-10 DIAGNOSIS — N201 Calculus of ureter: Secondary | ICD-10-CM

## 2022-03-13 LAB — CALCULI, WITH PHOTOGRAPH (CLINICAL LAB)
Calcium Oxalate Dihydrate: 50 %
Calcium Oxalate Monohydrate: 45 %
Hydroxyapatite: 5 %
Weight Calculi: 75 mg

## 2022-03-26 IMAGING — CT CT RENAL STONE PROTOCOL
2 of 4 series · 16 of 46 positions shown, 18 images · non-contrast
Comparison: None.

CLINICAL DATA: Left flank pain



[Series 2: stone full standard · axial · 0.93mm/px · z∈[-974,-489]mm · 13 of 107 slices shown, 15 images]
[im 5/107  soft-tissue]
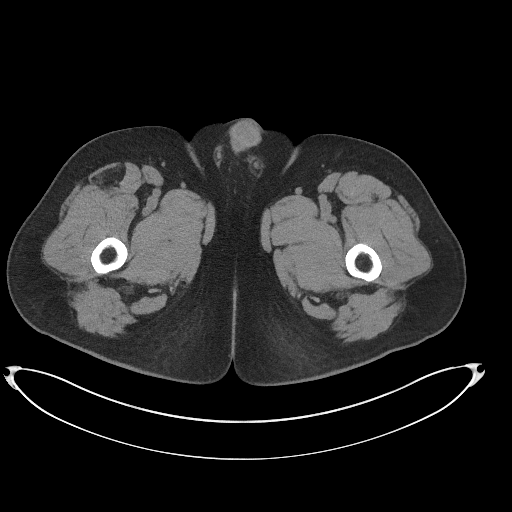
[im 5/107  bone]
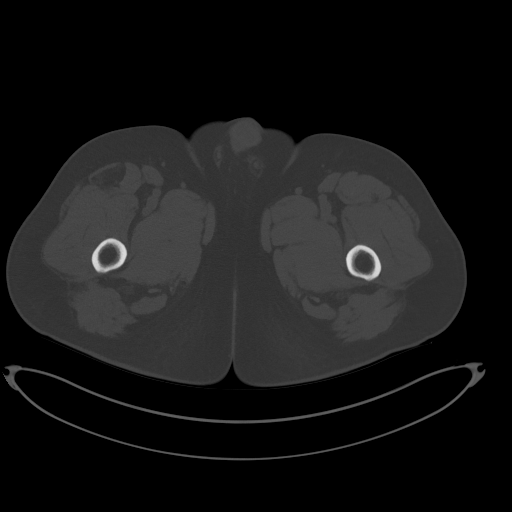
[im 14/107  soft-tissue]
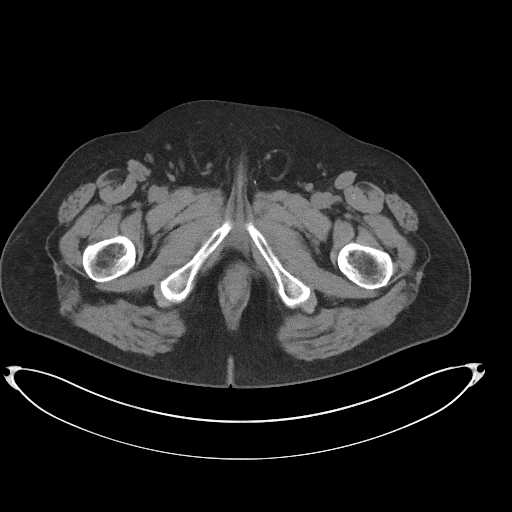
[im 24/107  soft-tissue]
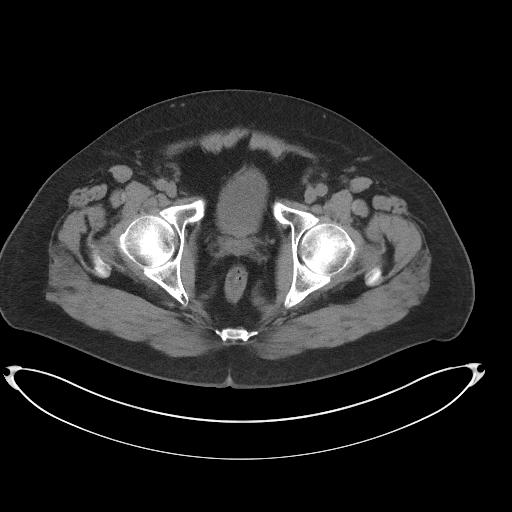
[im 28/107  soft-tissue]
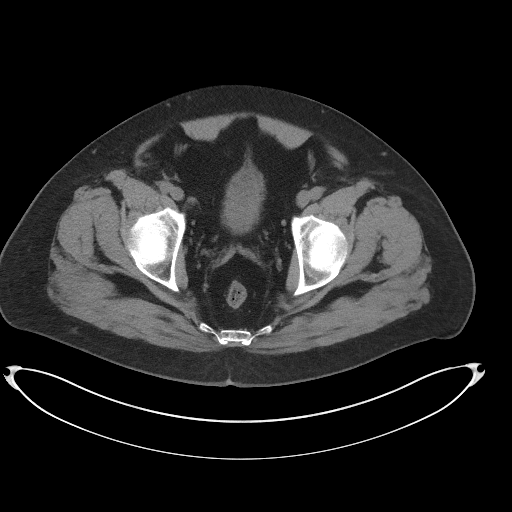
[im 37/107  soft-tissue]
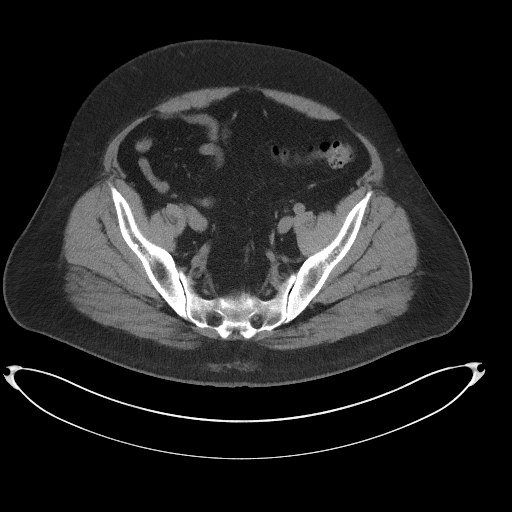
[im 47/107  soft-tissue]
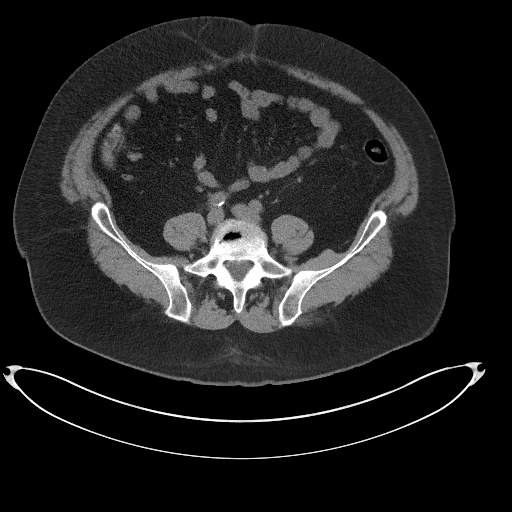
[im 56/107  soft-tissue]
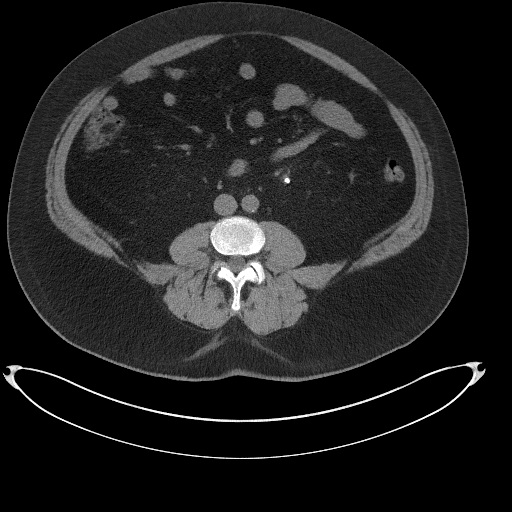
[im 60/107  soft-tissue]
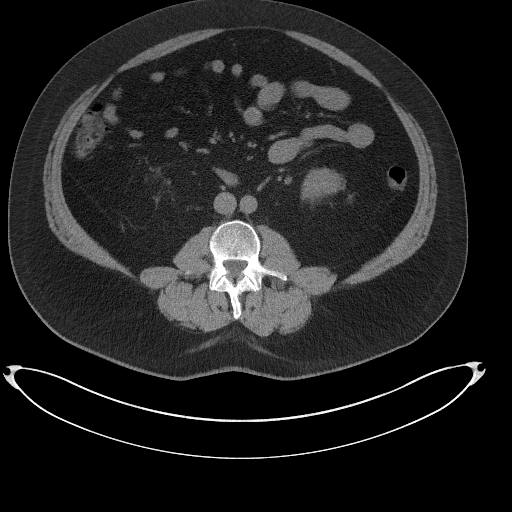
[im 70/107  soft-tissue]
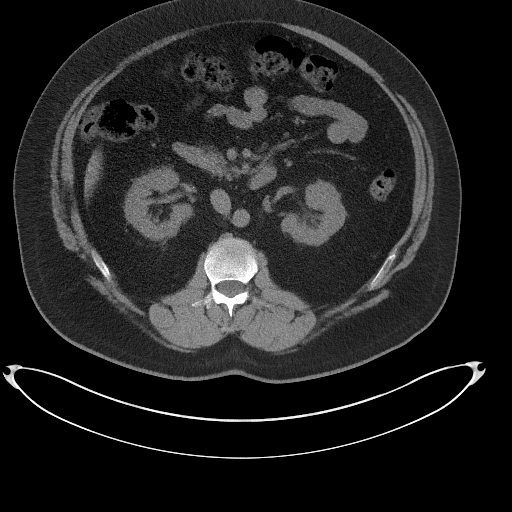
[im 70/107  bone]
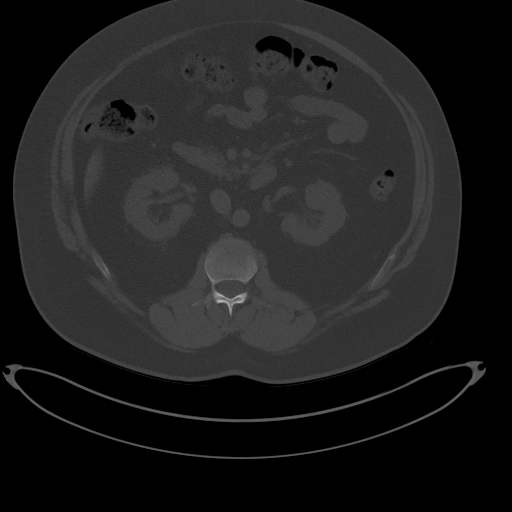
[im 79/107  soft-tissue]
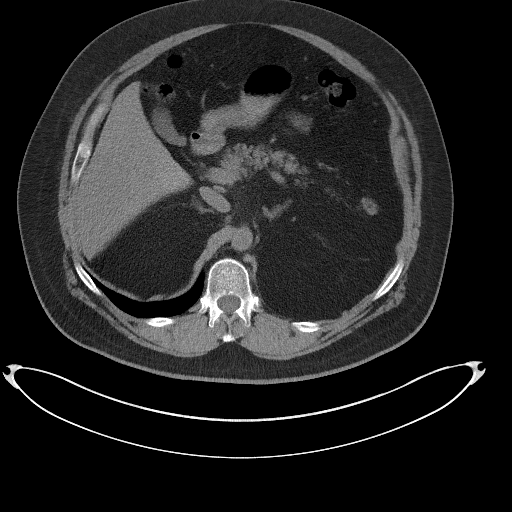
[im 83/107  soft-tissue]
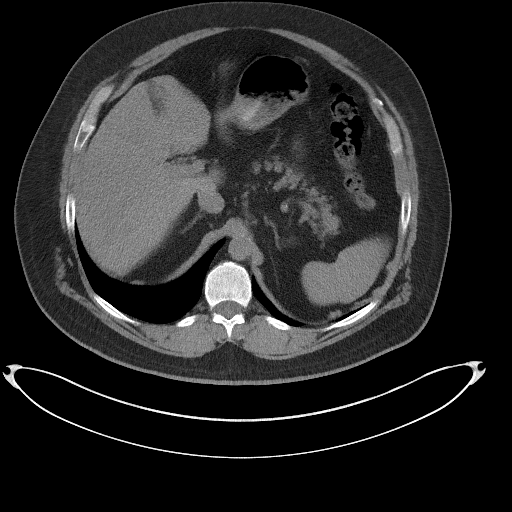
[im 93/107  soft-tissue]
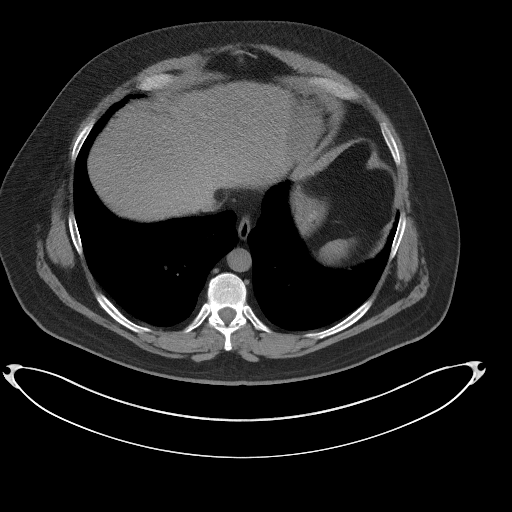
[im 102/107  soft-tissue]
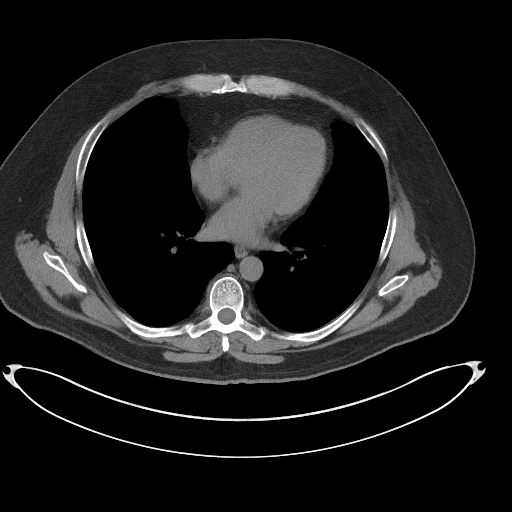

[Series 5: coronal · coronal · 0.92mm/px · 3 of 186 slices shown]
[im 62/186  soft-tissue]
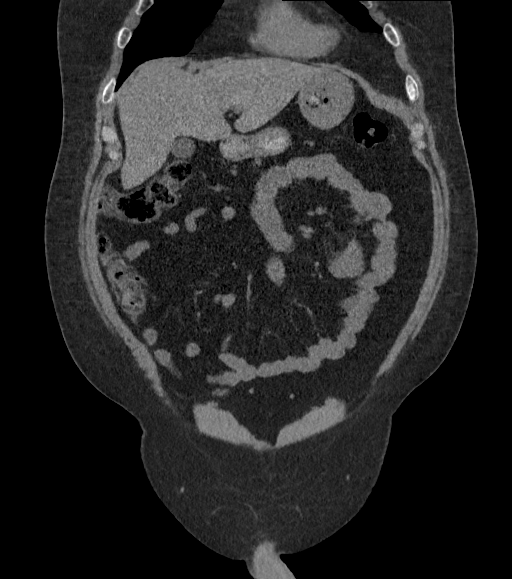
[im 83/186  soft-tissue]
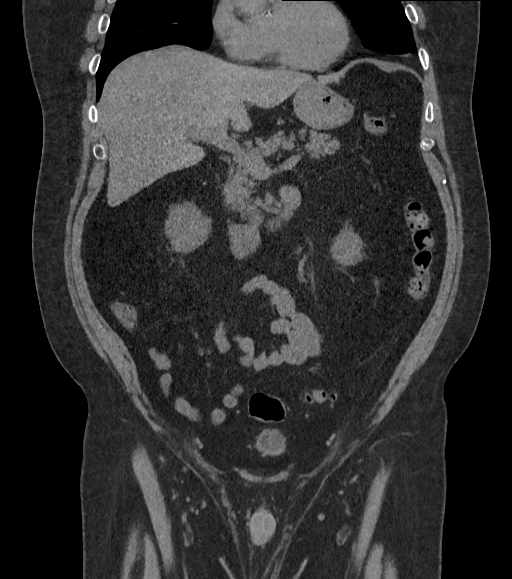
[im 103/186  soft-tissue]
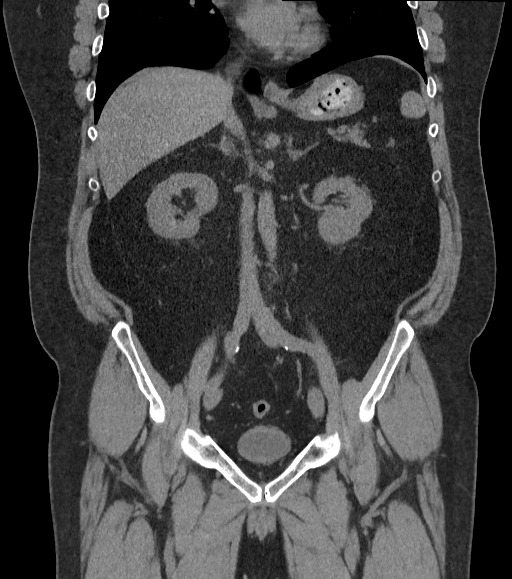

[16 of 46 positions shown; findings below may reference images not displayed]

FINDINGS: Lower chest: No acute abnormality.

Evaluation of the abdominal viscera somewhat limited by the lack of
IV contrast

Hepatobiliary: No focal liver abnormality is seen. Normal appearance
of the gallbladder.

Pancreas: Unremarkable. No surrounding inflammatory changes.

Spleen: Normal in size without focal abnormality.

Adrenals/Urinary Tract: Adrenal glands are unremarkable. Kidneys are
symmetric in size. There is a 4 mm calculus in the proximal left
ureter with very mild upstream hydronephrosis. No right renal
calculi or hydronephrosis. No suspicious mass lesion. Urinary
bladder is unremarkable.

Stomach/Bowel: Stomach is within normal limits. Appendix appears
normal. No evidence of bowel wall thickening, distention, or
inflammatory changes. Scattered colonic diverticula.

Vascular/Lymphatic: No enlarged abdominal or pelvic lymph nodes.

Reproductive: Prostate is unremarkable.

Other: Bilateral fat containing inguinal hernias. No abdominopelvic
ascites.

Musculoskeletal: L5-S1 degenerative disc disease.
IMPRESSION: 1. 4 mm calculus in the proximal left ureter with very mild upstream
hydronephrosis.
2. Bilateral fat containing inguinal hernias.

## 2022-03-29 IMAGING — CR DG ABDOMEN 1V
1 series · 2 of 2 positions shown · non-contrast
Comparison: 01/23/2022

CLINICAL DATA: Preoperative lithotripsy

EXAM:
ABDOMEN - 1 VIEW

[Series 1: dg abd 1 view · 0.14mm/px · 2 of 2 slices shown]
[im 1/2]
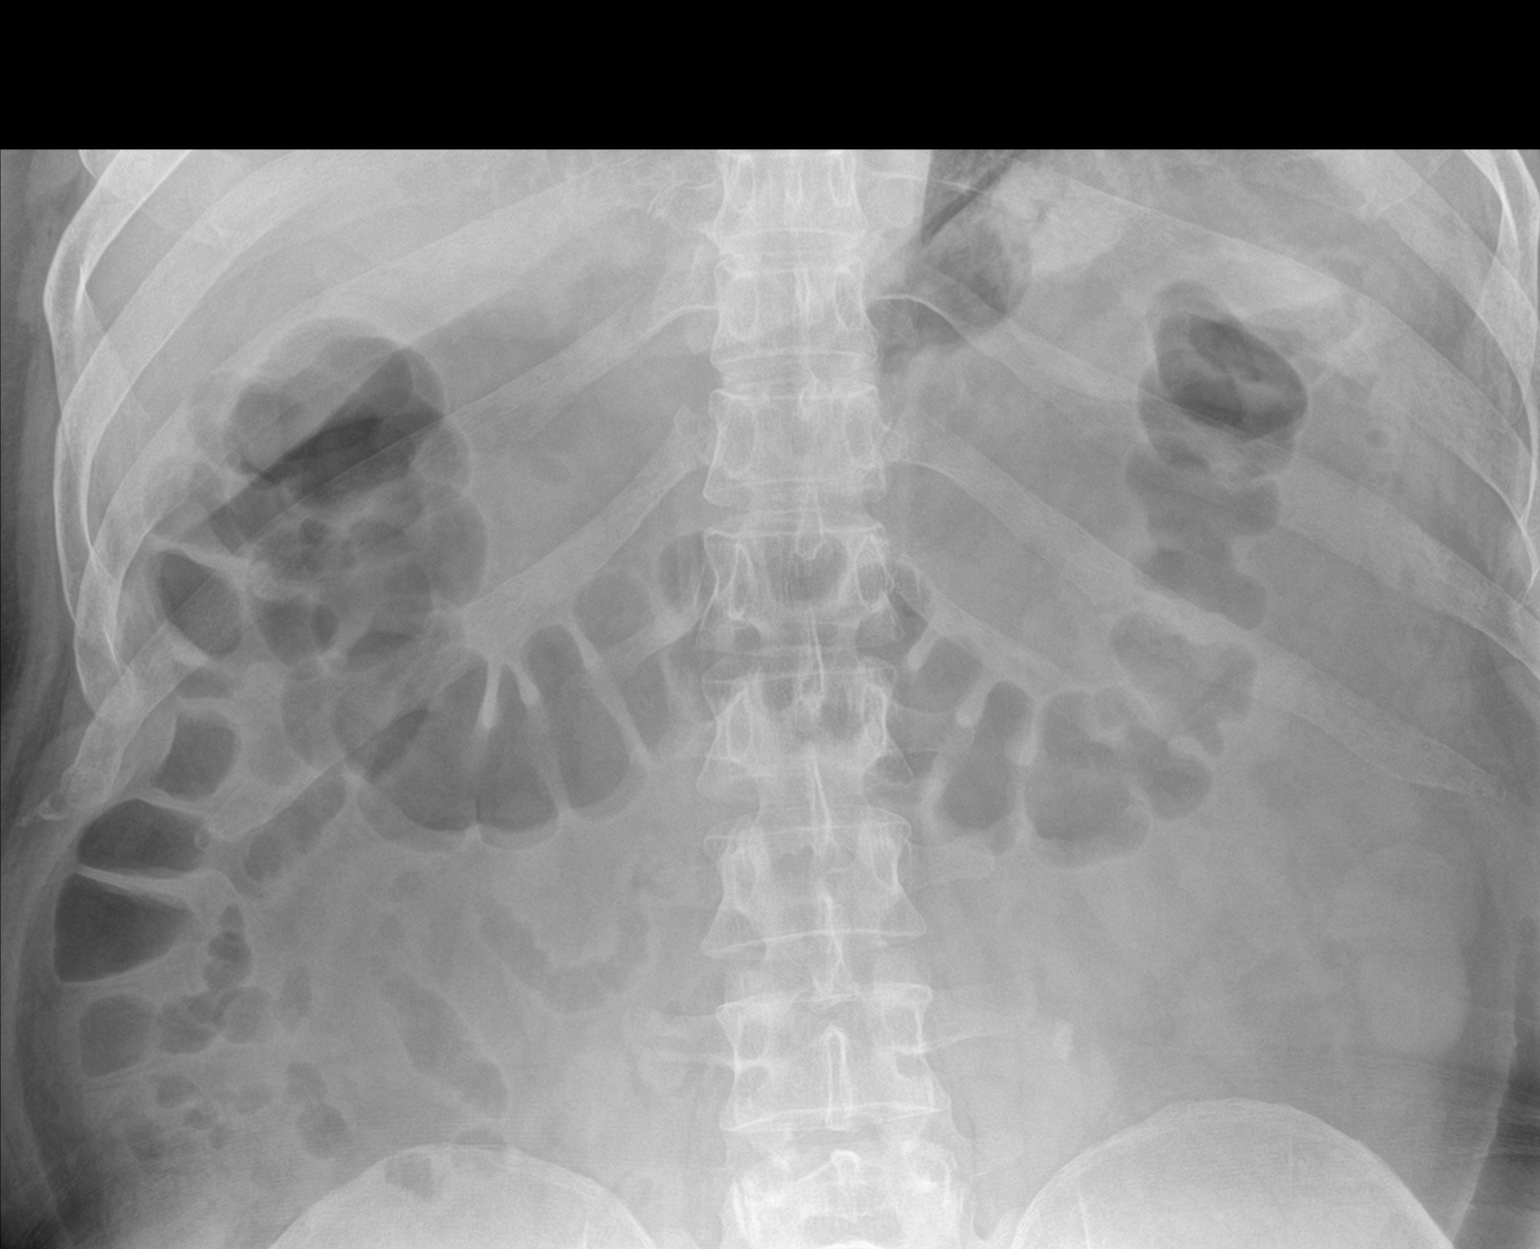
[im 2/2]
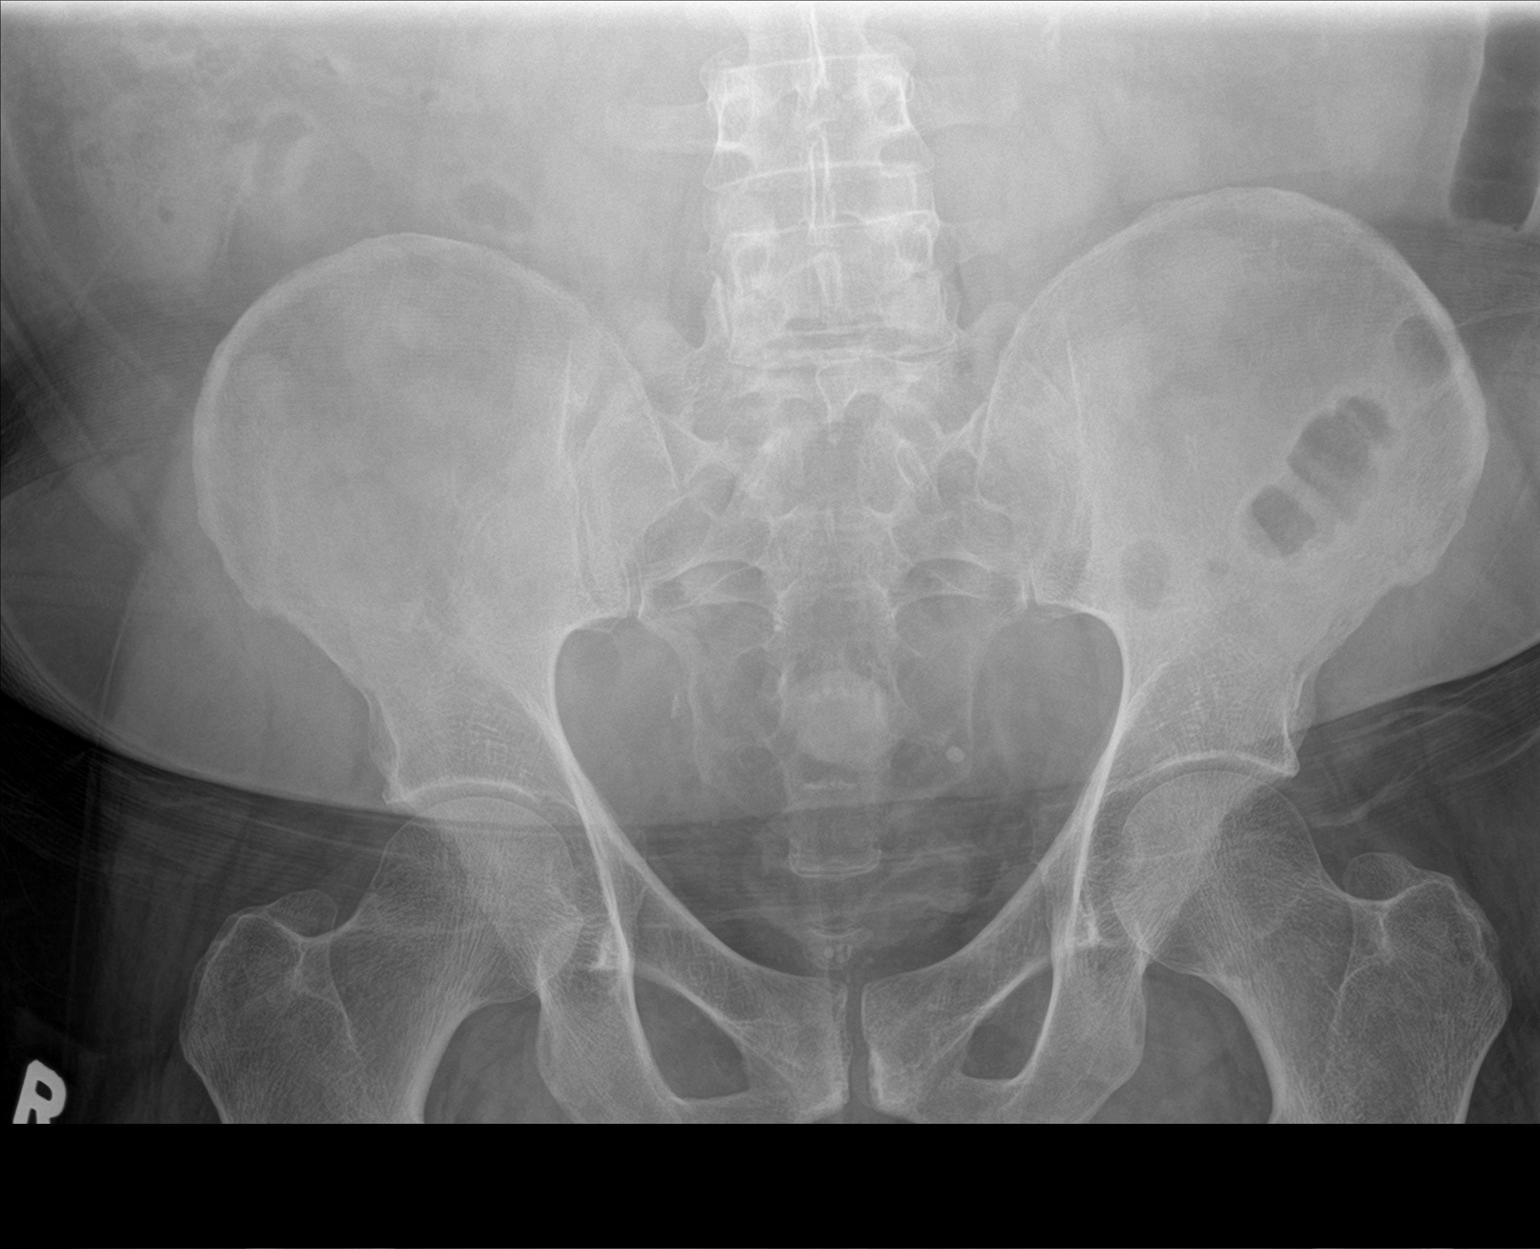

[2 of 2 positions shown; findings below may reference images not displayed]

FINDINGS: Similar position of proximal left ureteral calculus. No other
calculi identified. Bowel gas pattern is unremarkable.
IMPRESSION: Similar position of proximal left ureteral calculus.

## 2023-05-01 ENCOUNTER — Encounter: Payer: Self-pay | Admitting: Emergency Medicine

## 2023-05-01 ENCOUNTER — Ambulatory Visit
Admission: EM | Admit: 2023-05-01 | Discharge: 2023-05-01 | Disposition: A | Discharge status: Home / Self Care | Payer: BC Managed Care – PPO | Attending: Emergency Medicine | Admitting: Emergency Medicine

## 2023-05-01 DIAGNOSIS — J069 Acute upper respiratory infection, unspecified: Secondary | ICD-10-CM | POA: Diagnosis not present

## 2023-05-01 LAB — GROUP A STREP BY PCR: Group A Strep by PCR: NOT DETECTED

## 2023-05-01 MED ORDER — IPRATROPIUM BROMIDE 0.06 % NA SOLN: 2.0000 | Freq: Four times a day (QID) | NASAL | 12 refills | Status: DC

## 2023-05-01 NOTE — Discharge Instructions (Addendum)
Your strep test today was negative but your physical exam is suggestive of an upper respiratory infection that is most likely viral.  I believe your sore throat is being caused by postnasal drip.  Use the Atrovent nasal spray, 2 squirts up each nostril every 6 hours, as needed for postnasal drip and congestion.  You may also gargle with warm salt water to help wash away drainage from the back of your throat and soothe the tissues.  Over-the-counter Chloraseptic and/or Sucrets lozenges may also be effective.  No more than 1 lozenge every 2 hours as the menthol may give you diarrhea.  If your symptoms do not improve, new symptoms develop, or your symptoms worsen please return for reevaluation or see your PCP.

## 2023-05-01 NOTE — ED Triage Notes (Signed)
Patient c/o sore throat for 2 days.  Patient denies fevers.  

## 2023-05-01 NOTE — ED Provider Notes (Signed)
[facility]    CSN: [id number] Arrival date & time: 05/01/23  4098      History   Chief Complaint Chief Complaint  Patient presents with   Sore Throat    HPI Logan Erickson is a 57 y.o. male.   HPI  57 year old male with a past medical history significant for depression, kidney stones, migraines presents for evaluation of sore throat that is been present for last 2 days.  He reports that he has had an elevated temp but no measured fever and that the highest he measured was 99.3.  He has had ringing in his ears along with a headache and postnasal drip.  He denies any nasal discharge or cough.  No known sick contacts.  Past Medical History:  Diagnosis Date   Depression    kidney stone    Migraine     There are no problems to display for this patient.   Past Surgical History:  Procedure Laterality Date   EXTRACORPOREAL SHOCK WAVE LITHOTRIPSY Left 01/30/2022   Procedure: EXTRACORPOREAL SHOCK WAVE LITHOTRIPSY (ESWL);  Surgeon: Sondra Come, MD;  Location: [facility] ORS;  Service: Urology;  Laterality: Left;   reconsturctive surgery right thumb         Home Medications    Prior to Admission medications   Medication Sig Start Date End Date Taking? Authorizing Provider  buPROPion (WELLBUTRIN XL) 150 MG 24 hr tablet Take 150 mg by mouth every morning. 09/02/21  Yes [provider]  ipratropium (ATROVENT) 0.06 % nasal spray Place 2 sprays into both nostrils 4 (four) times daily. 05/01/23  Yes Becky Augusta, NP  LORazepam (ATIVAN) 0.5 MG tablet Take 0.5 mg by mouth 2 (two) times daily as needed. 11/15/21  Yes [provider]  metoprolol succinate (TOPROL-XL) 100 MG 24 hr tablet Take 1 tablet by mouth daily. 03/19/17  Yes [provider]  venlafaxine (EFFEXOR) 75 MG tablet Take by mouth. 12/01/16  Yes [provider]  butalbital-acetaminophen-caffeine (FIORICET) 50-325-40 MG tablet Take by mouth. 03/07/22   [provider]   Tamsulosin HCl (FLOMAX) 0.4 MG CAPS Take 0.4 mg by mouth daily after breakfast.    [provider]    Family History History reviewed. No pertinent family history.  Social History Social History   Tobacco Use   Smoking status: Former    Packs/day: 0.25    Years: 1.00    Additional pack years: 0.00    Total pack years: 0.25    Types: Cigarettes    Quit date: 02/08/1989    Years since quitting: 34.2    Passive exposure: Past   Smokeless tobacco: Never  Vaping Use   Vaping Use: Never used  Substance Use Topics   Alcohol use: Yes   Drug use: No     Allergies   Ciprofloxacin and Erythromycin   Review of Systems Review of Systems  Constitutional:  Negative for fever.  HENT:  Positive for congestion, postnasal drip, sore throat and tinnitus. Negative for rhinorrhea.   Respiratory:  Negative for cough, shortness of breath and wheezing.      Physical Exam Triage Vital Signs ED Triage Vitals  Enc Vitals Group     BP 05/01/23 1002 121/82     Pulse Rate 05/01/23 1002 62     Resp 05/01/23 1002 15     Temp 05/01/23 1002 98.4 F (36.9 C)     Temp Source 05/01/23 1002 Oral     SpO2 05/01/23 1002 99 %  Weight 05/01/23 0959 257 lb 15 oz (117 kg)     Height 05/01/23 0959 5\' 11"  (1.803 m)     Head Circumference --      Peak Flow --      Pain Score 05/01/23 0959 5     Pain Loc --      Pain Edu? --      Excl. in GC? --    No data found.  Updated Vital Signs BP 121/82 (BP Location: Left Arm)   Pulse 62   Temp 98.4 F (36.9 C) (Oral)   Resp 15   Ht 5\' 11"  (1.803 m)   Wt 257 lb 15 oz (117 kg)   SpO2 99%   BMI 35.98 kg/m   Visual Acuity Right Eye Distance:   Left Eye Distance:   Bilateral Distance:    Right Eye Near:   Left Eye Near:    Bilateral Near:     Physical Exam Vitals and nursing note reviewed.  Constitutional:      Appearance: Normal appearance. He is not ill-appearing.  HENT:     Head: Normocephalic and atraumatic.     Right Ear:  Tympanic membrane, ear canal and external ear normal. There is no impacted cerumen.     Left Ear: Tympanic membrane, ear canal and external ear normal. There is no impacted cerumen.     Nose: Congestion and rhinorrhea present.     Comments: Wendall Papa is erythematous and edematous but no appreciable discharge.    Mouth/Throat:     Mouth: Mucous membranes are moist.     Pharynx: Oropharynx is clear. Posterior oropharyngeal erythema present. No oropharyngeal exudate.     Comments: Tonsillar pillars are 1+ edematous and erythematous but free of exudate.  Posterior oropharynx also has mild erythema with clear postnasal drip. Cardiovascular:     Rate and Rhythm: Normal rate and regular rhythm.     Pulses: Normal pulses.     Heart sounds: Normal heart sounds. No murmur heard.    No friction rub. No gallop.  Pulmonary:     Effort: Pulmonary effort is normal.     Breath sounds: Normal breath sounds. No wheezing, rhonchi or rales.  Musculoskeletal:     Cervical back: Normal range of motion and neck supple.  Lymphadenopathy:     Cervical: No cervical adenopathy.  Skin:    General: Skin is warm and dry.     Capillary Refill: Capillary refill takes less than 2 seconds.     Findings: No erythema or rash.  Neurological:     General: No focal deficit present.     Mental Status: He is alert and oriented to person, place, and time.  Psychiatric:        Mood and Affect: Mood normal.        Behavior: Behavior normal.        Thought Content: Thought content normal.        Judgment: Judgment normal.      UC Treatments / Results  Labs (all labs ordered are listed, but only abnormal results are displayed) Labs Reviewed  GROUP A STREP BY PCR    EKG   Radiology No results found.  Procedures Procedures (including critical care time)  Medications Ordered in UC Medications - No data to display  Initial Impression / Assessment and Plan / UC Course  I have reviewed the triage vital signs and the  nursing notes.  Pertinent labs & imaging results that were available during my care of  the patient were reviewed by me and considered in my medical decision making (see chart for details).   Patient is a nontoxic-appearing 57 year old male presenting for evaluation of sore throat with associated respiratory symptoms as outlined in HPI above.  Given the fact that he has erythema and edema to bilateral tonsillar pillars I will order a strep PCR.  I suspect that his symptoms are more a result of an upper respiratory infection and the postnasal drip is what is causing the sore throat.  PCR is negative.  I will discharge patient home with a diagnosis of viral URI and treat him with Atrovent nasal spray.  Over-the-counter Tylenol and/or ibuprofen as needed for discomfort.  Salt water gargles and over-the-counter Chloraseptic or Sucrets lozenges to soothe the sore throat.   Final Clinical Impressions(s) / UC Diagnoses   Final diagnoses:  Viral upper respiratory tract infection     Discharge Instructions      Your strep test today was negative but your physical exam is suggestive of an upper respiratory infection that is most likely viral.  I believe your sore throat is being caused by postnasal drip.  Use the Atrovent nasal spray, 2 squirts up each nostril every 6 hours, as needed for postnasal drip and congestion.  You may also gargle with warm salt water to help wash away drainage from the back of your throat and soothe the tissues.  Over-the-counter Chloraseptic and/or Sucrets lozenges may also be effective.  No more than 1 lozenge every 2 hours as the menthol may give you diarrhea.  If your symptoms do not improve, new symptoms develop, or your symptoms worsen please return for reevaluation or see your PCP.     ED Prescriptions     Medication Sig Dispense Auth. Provider   ipratropium (ATROVENT) 0.06 % nasal spray Place 2 sprays into both nostrils 4 (four) times daily. 15 mL Becky Augusta, NP      PDMP not reviewed this encounter.   Becky Augusta, NP 05/01/23 1046

## 2023-05-29 ENCOUNTER — Ambulatory Visit: Payer: BC Managed Care – PPO

## 2023-05-29 DIAGNOSIS — K640 First degree hemorrhoids: Secondary | ICD-10-CM | POA: Diagnosis not present

## 2023-05-29 DIAGNOSIS — K5730 Diverticulosis of large intestine without perforation or abscess without bleeding: Secondary | ICD-10-CM | POA: Diagnosis not present

## 2023-05-29 DIAGNOSIS — Z1211 Encounter for screening for malignant neoplasm of colon: Secondary | ICD-10-CM | POA: Diagnosis not present

## 2024-06-05 ENCOUNTER — Ambulatory Visit
Admission: EM | Admit: 2024-06-05 | Discharge: 2024-06-05 | Disposition: A | Discharge status: Home / Self Care | Attending: Emergency Medicine | Admitting: Emergency Medicine

## 2024-06-05 ENCOUNTER — Encounter: Payer: Self-pay | Admitting: Emergency Medicine

## 2024-06-05 DIAGNOSIS — J029 Acute pharyngitis, unspecified: Secondary | ICD-10-CM | POA: Diagnosis present

## 2024-06-05 DIAGNOSIS — Z20822 Contact with and (suspected) exposure to covid-19: Secondary | ICD-10-CM | POA: Insufficient documentation

## 2024-06-05 LAB — RESP PANEL BY RT-PCR (FLU A&B, COVID) ARPGX2
Influenza A by PCR: NEGATIVE
Influenza B by PCR: NEGATIVE
SARS Coronavirus 2 by RT PCR: NEGATIVE

## 2024-06-05 LAB — GROUP A STREP BY PCR: Group A Strep by PCR: NOT DETECTED

## 2024-06-05 MED ORDER — IPRATROPIUM BROMIDE 0.06 % NA SOLN: 2.0000 | Freq: Four times a day (QID) | NASAL | 0 refills | Status: DC

## 2024-06-05 MED ORDER — IBUPROFEN 600 MG PO TABS: 600.0000 mg | ORAL_TABLET | Freq: Three times a day (TID) | ORAL | 0 refills | Status: AC | PRN

## 2024-06-05 NOTE — ED Triage Notes (Signed)
Patient c/o sore throat that started yesterday.  Patient denies fevers.  

## 2024-06-05 NOTE — Discharge Instructions (Signed)
 COVID, flu, strep PCR testing negative.  Saline nasal irrigation with a NeilMed sinus rinse and distilled water as often as you want and Atrovent  nasal spray to help reduce the postnasal drip.  1 gram of Tylenol  and 600 mg ibuprofen  together 3-4 times a day as needed for pain.  Make sure you drink plenty of extra fluids.  Some people find salt water gargles and  Traditional Medicinal's Throat Coat tea helpful. Take 5 mL of liquid Benadryl  and 5 mL of Maalox/Mylanta. Mix it together, and then hold it in your mouth for as long as you can and then swallow. You may do this 4 times a day.  Honey and lemon dissolved in hot water can also be soothing.  Go to www.goodrx.com  or www.costplusdrugs.com to look up your medications. This will give you a list of where you can find your prescriptions at the most affordable prices. Or ask the pharmacist what the cash price is, or if they have any other discount programs available to help make your medication more affordable. This can be less expensive than what you would pay with insurance.

## 2024-06-05 NOTE — ED Provider Notes (Addendum)
 HPI  SUBJECTIVE:  Patient reports sore throat starting 2 to 3 days ago accompanied with bilateral ear pain, tinnitus. Sx worse with swallowing, talking.  Sx better with Advil .  Last dose was within 6 hours of evaluation.  States this feels , like strep. No fever No cervical lymphadenopathy  no neck stiffness  No Cough, wheezing, chest congestion No nasal congestion, rhinorrhea,  +postnasal drip No Myalgias No Headache No Rash  No shortness of breath  No nausea, vomiting No diarrhea No abdominal pain     No Recent Strep, flu, COVID exposure + reflux sxs No Allergy sxs  No Breathing difficulty, voice changes, sensation of throat swelling shut No Drooling No Trismus No abx in past month.  He has a past medical history of migraines, depression, OSA on CPAP, GERD and atrial fibrillation on metoprolol only.   PCP: [facility].   Past Medical History:  Diagnosis Date   Depression    kidney stone    Migraine     Past Surgical History:  Procedure Laterality Date   EXTRACORPOREAL SHOCK WAVE LITHOTRIPSY Left 01/30/2022   Procedure: EXTRACORPOREAL SHOCK WAVE LITHOTRIPSY (ESWL);  Surgeon: Francisca Redell BROCKS, MD;  Location: [facility] ORS;  Service: Urology;  Laterality: Left;   reconsturctive surgery right thumb      History reviewed. No pertinent family history.  Social History   Tobacco Use   Smoking status: Former    Current packs/day: 0.00    Average packs/day: 0.3 packs/day for 1 year (0.3 ttl pk-yrs)    Types: Cigarettes    Start date: 02/09/1988    Quit date: 02/08/1989    Years since quitting: 35.3    Passive exposure: Past   Smokeless tobacco: Never  Vaping Use   Vaping status: Never Used  Substance Use Topics   Alcohol use: Yes   Drug use: No    No current facility-administered medications for this encounter.  Current Outpatient Medications:    buPROPion (WELLBUTRIN XL) 150 MG 24 hr tablet, Take 150 mg by mouth every morning., Disp: , Rfl:    ibuprofen   (ADVIL ) 600 MG tablet, Take 1 tablet (600 mg total) by mouth every 8 (eight) hours as needed., Disp: 30 tablet, Rfl: 0   ipratropium (ATROVENT ) 0.06 % nasal spray, Place 2 sprays into both nostrils 4 (four) times daily., Disp: 15 mL, Rfl: 0   metoprolol succinate (TOPROL-XL) 100 MG 24 hr tablet, Take 1 tablet by mouth daily., Disp: , Rfl:    venlafaxine (EFFEXOR) 75 MG tablet, Take by mouth., Disp: , Rfl:    LORazepam (ATIVAN) 0.5 MG tablet, Take 0.5 mg by mouth 2 (two) times daily as needed., Disp: , Rfl:   Allergies  Allergen Reactions   Ciprofloxacin  Other (See Comments)    tendinitis   Erythromycin Rash     ROS  As noted in HPI.   Physical Exam  BP 122/80 (BP Location: Right Arm)   Pulse (!) 56   Temp 98.6 F (37 C) (Oral)   Resp 16   Ht 5' 11 (1.803 m)   Wt 117.9 kg   SpO2 96%   BMI 36.26 kg/m   Constitutional: Well developed, well nourished, no acute distress Eyes:  EOMI, conjunctiva normal bilaterally HENT: Normocephalic, atraumatic,mucus membranes moist.  TMs normal bilaterally.  Mild nasal congestion.  Erythematous oropharynx.  Normal tonsils without exudates.  Uvula midline.  No neck stiffness.  Normal voice.  No drooling, trismus.  Extensive postnasal drip.  Positive cobblestoning. Respiratory: Normal inspiratory effort  Cardiovascular: Normal rate,, regular rhythm.  No murmurs, rubs, gallops GI: nondistended, nontender. No appreciable splenomegaly skin: No rash, skin intact Lymph: + Anterior cervical LN.  No posterior cervical lymphadenopathy. Musculoskeletal: no deformities Neurologic: Alert & oriented x 3, no focal neuro deficits Psychiatric: Speech and behavior appropriate.  ED Course   Medications - No data to display  Orders Placed This Encounter  Procedures   Group A Strep by PCR    Standing Status:   Standing    Number of Occurrences:   1   Resp Panel by RT-PCR (Flu A&B, Covid) Anterior Nasal Swab    Standing Status:   Standing    Number of  Occurrences:   1    Results for orders placed or performed during the hospital encounter of 06/05/24 (from the past 24 hours)  Group A Strep by PCR     Status: None   Collection Time: 06/05/24 12:00 PM   Specimen: Throat; Sterile Swab  Result Value Ref Range   Group A Strep by PCR NOT DETECTED NOT DETECTED  Resp Panel by RT-PCR (Flu A&B, Covid) Anterior Nasal Swab     Status: None   Collection Time: 06/05/24 12:00 PM   Specimen: Anterior Nasal Swab  Result Value Ref Range   SARS Coronavirus 2 by RT PCR NEGATIVE NEGATIVE   Influenza A by PCR NEGATIVE NEGATIVE   Influenza B by PCR NEGATIVE NEGATIVE   No results found.  ED Clinical Impression  1. Viral pharyngitis   2. Lab test negative for COVID-19 virus      ED Assessment/Plan    Strep, COVID, influenza PCR negative, discussed this with patient while in department.  Patient home with ibuprofen , Tylenol , Benadryl /Maalox mixture, Atrovent  nasal spray. Patient to followup with PCP when necessary,   Discussed labs,  MDM, plan and followup with patient. Discussed sn/sx that should prompt return to the ED. patient agrees with plan.   Meds ordered this encounter  Medications   ibuprofen  (ADVIL ) 600 MG tablet    Sig: Take 1 tablet (600 mg total) by mouth every 8 (eight) hours as needed.    Dispense:  30 tablet    Refill:  0   ipratropium (ATROVENT ) 0.06 % nasal spray    Sig: Place 2 sprays into both nostrils 4 (four) times daily.    Dispense:  15 mL    Refill:  0     *This clinic note was created using Scientist, clinical (histocompatibility and immunogenetics). Therefore, there may be occasional mistakes despite careful proofreading.     Van Knee, MD 06/05/24 ARTEMUS Van Knee, MD 06/05/24 [phone]

## 2024-06-11 ENCOUNTER — Ambulatory Visit
Admission: EM | Admit: 2024-06-11 | Discharge: 2024-06-11 | Disposition: A | Discharge status: Home / Self Care | Attending: Emergency Medicine | Admitting: Emergency Medicine

## 2024-06-11 DIAGNOSIS — J069 Acute upper respiratory infection, unspecified: Secondary | ICD-10-CM

## 2024-06-11 MED ORDER — BENZONATATE 100 MG PO CAPS: 200.0000 mg | ORAL_CAPSULE | Freq: Three times a day (TID) | ORAL | 0 refills | Status: DC

## 2024-06-11 MED ORDER — AMOXICILLIN-POT CLAVULANATE 875-125 MG PO TABS: 1.0000 | ORAL_TABLET | Freq: Two times a day (BID) | ORAL | 0 refills | Status: AC

## 2024-06-11 MED ORDER — PROMETHAZINE-DM 6.25-15 MG/5ML PO SYRP: 5.0000 mL | ORAL_SOLUTION | Freq: Four times a day (QID) | ORAL | 0 refills | Status: DC | PRN

## 2024-06-11 NOTE — Discharge Instructions (Addendum)
 The Augmentin twice daily with food for 7 days for treatment of your URI.  Perform sinus irrigation 2-3 times a day with a NeilMed sinus rinse kit and distilled water.  Do not use tap water.  You can use plain over-the-counter Mucinex every 6 hours to break up the stickiness of the mucus so your body can clear it.  Increase your oral fluid intake to thin out your mucus so that is also able for your body to clear more easily.  Take an over-the-counter probiotic, such as Culturelle-align-activia, 1 hour after each dose of antibiotic to prevent diarrhea.  Use the Atrovent nasal spray, 2 squirts in each nostril every 6 hours, as needed for runny nose and postnasal drip.  Use the Tessalon Perles every 8 hours during the day.  Take them with a small sip of water.  They may give you some numbness to the base of your tongue or a metallic taste in your mouth, this is normal.  Use the Promethazine DM cough syrup at bedtime for cough and congestion.  It will make you drowsy so do not take it during the day.  Return for reevaluation or see your primary care provider for any new or worsening symptoms.

## 2024-06-11 NOTE — ED Triage Notes (Signed)
 Pt c/o sore throat & congestion x8 days. Was seen on 7/6 for the same issue & tested neg for strep & covid/flu. Given ibu & atrovent  w/o relief.

## 2024-06-11 NOTE — ED Provider Notes (Signed)
 [facility]    CSN: [id number] Arrival date & time: 06/11/24  9097      History   Chief Complaint Chief Complaint  Patient presents with   Sore Throat   Nasal Congestion    HPI Logan Erickson is a 58 y.o. male.   HPI  58 year old male with past medical significant for migraine headaches, kidney stones, depression presents for evaluation of 8 days with respiratory symptoms.  He reports that he has had some afternoons and elevated temp of 99.5 but no true measured fever.  He is endorsing nasal congestion with clear nasal discharge, some discharge from his left eye over the last 2 to 3 days, ear pain, sore throat, and a cough that is productive for a yellow mucus.  No shortness of breath but he does endorse some mild intermittent wheezing.  He has been using ibuprofen  and Atrovent  nasal spray without improvement of symptoms.  Past Medical History:  Diagnosis Date   Depression    kidney stone    Migraine     There are no active problems to display for this patient.   Past Surgical History:  Procedure Laterality Date   EXTRACORPOREAL SHOCK WAVE LITHOTRIPSY Left 01/30/2022   Procedure: EXTRACORPOREAL SHOCK WAVE LITHOTRIPSY (ESWL);  Surgeon: Francisca Redell BROCKS, MD;  Location: [facility] OR[facility];  Service: Urology;  Laterality: Left;   reconsturctive surgery right thumb         Home Medications    Prior to Admission medications   Medication Sig Start Date End Date Taking? Authorizing Provider  amoxicillin -clavulanate (AUGMENTIN ) 875-125 MG tablet Take 1 tablet by mouth every 12 (twelve) hours for 7 days. 06/11/24 06/18/24 Yes Bernardino Ditch, NP  benzonatate  (TESSALON ) 100 MG capsule Take 2 capsules (200 mg total) by mouth every 8 (eight) hours. 06/11/24  Yes Bernardino Ditch, NP  promethazine -dextromethorphan (PROMETHAZINE -DM) 6.25-15 MG/5ML syrup Take 5 mLs by mouth 4 (four) times daily as needed. 06/11/24  Yes Bernardino Ditch, NP  buPROPion (WELLBUTRIN XL) 150 MG 24 hr tablet Take 150  mg by mouth every morning. 09/02/21   [provider]  ibuprofen  (ADVIL ) 600 MG tablet Take 1 tablet (600 mg total) by mouth every 8 (eight) hours as needed. 06/05/24   Mortenson, Ashley, MD  ipratropium (ATROVENT ) 0.06 % nasal spray Place 2 sprays into both nostrils 4 (four) times daily. 06/05/24   Mortenson, Ashley, MD  LORazepam (ATIVAN) 0.5 MG tablet Take 0.5 mg by mouth 2 (two) times daily as needed. 11/15/21   [provider]  metoprolol succinate (TOPROL-XL) 100 MG 24 hr tablet Take 1 tablet by mouth daily. 03/19/17   [provider]  venlafaxine (EFFEXOR) 75 MG tablet Take by mouth. 12/01/16   [provider]    Family History History reviewed. No pertinent family history.  Social History Social History   Tobacco Use   Smoking status: Former    Current packs/day: 0.00    Average packs/day: 0.3 packs/day for 1 year (0.3 ttl pk-yrs)    Types: Cigarettes    Start date: 02/09/1988    Quit date: 02/08/1989    Years since quitting: 35.3    Passive exposure: Past   Smokeless tobacco: Never  Vaping Use   Vaping status: Never Used  Substance Use Topics   Alcohol use: Yes   Drug use: No     Allergies   Ciprofloxacin  and Erythromycin   Review of Systems Review of Systems  Constitutional:  Negative for fever.  HENT:  Positive for  congestion, ear pain, rhinorrhea, sore throat and voice change. Negative for sinus pressure.   Eyes:  Positive for discharge. Negative for photophobia, pain, redness and visual disturbance.  Respiratory:  Positive for cough and wheezing. Negative for shortness of breath.      Physical Exam Triage Vital Signs ED Triage Vitals  Encounter Vitals Group     BP 06/11/24 0907 120/79     Girls Systolic BP Percentile --      Girls Diastolic BP Percentile --      Boys Systolic BP Percentile --      Boys Diastolic BP Percentile --      Pulse Rate 06/11/24 0907 76     Resp 06/11/24 0907 16     Temp 06/11/24 0907 98.8 F (37.1  C)     Temp Source 06/11/24 0907 Oral     SpO2 06/11/24 0907 98 %     Weight 06/11/24 0906 260 lb (117.9 kg)     Height 06/11/24 0906 5' 11 (1.803 m)     Head Circumference --      Peak Flow --      Pain Score 06/11/24 0910 6     Pain Loc --      Pain Education --      Exclude from Growth Chart --    No data found.  Updated Vital Signs BP 120/79 (BP Location: Right Arm)   Pulse 76   Temp 98.8 F (37.1 C) (Oral)   Resp 16   Ht 5' 11 (1.803 m)   Wt 260 lb (117.9 kg)   SpO2 98%   BMI 36.26 kg/m   Visual Acuity Right Eye Distance:   Left Eye Distance:   Bilateral Distance:    Right Eye Near:   Left Eye Near:    Bilateral Near:     Physical Exam Vitals and nursing note reviewed.  Constitutional:      Appearance: Normal appearance. He is not ill-appearing.  HENT:     Head: Normocephalic and atraumatic.     Right Ear: Tympanic membrane, ear canal and external ear normal. There is no impacted cerumen.     Left Ear: Tympanic membrane, ear canal and external ear normal. There is no impacted cerumen.     Nose: Congestion and rhinorrhea present.     Comments: Patient mucosa is edematous and erythematous with clear discharge in both nares.    Mouth/Throat:     Mouth: Mucous membranes are moist.     Pharynx: Oropharynx is clear. Posterior oropharyngeal erythema present. No oropharyngeal exudate.     Comments: Mild erythema to the posterior pharynx with clear postnasal drip.  No exudate noted.  Tonsillar pillars are unremarkable. Eyes:     Extraocular Movements: Extraocular movements intact.     Conjunctiva/sclera: Conjunctivae normal.     Pupils: Pupils are equal, round, and reactive to light.  Cardiovascular:     Rate and Rhythm: Normal rate and regular rhythm.     Pulses: Normal pulses.     Heart sounds: Normal heart sounds. No murmur heard.    No friction rub. No gallop.  Pulmonary:     Effort: Pulmonary effort is normal.     Breath sounds: Normal breath sounds. No  wheezing, rhonchi or rales.  Musculoskeletal:     Cervical back: Normal range of motion and neck supple. No tenderness.  Lymphadenopathy:     Cervical: No cervical adenopathy.  Skin:    General: Skin is warm and dry.  Capillary Refill: Capillary refill takes less than 2 seconds.     Findings: No rash.  Neurological:     General: No focal deficit present.     Mental Status: He is alert and oriented to person, place, and time.      UC Treatments / Results  Labs (all labs ordered are listed, but only abnormal results are displayed) Labs Reviewed - No data to display  EKG   Radiology No results found.  Procedures Procedures (including critical care time)  Medications Ordered in UC Medications - No data to display  Initial Impression / Assessment and Plan / UC Course  I have reviewed the triage vital signs and the nursing notes.  Pertinent labs & imaging results that were available during my care of the patient were reviewed by me and considered in my medical decision making (see chart for details).   Patient is a nontoxic-appearing 58 year old male presenting for evaluation of 8 days worth of respiratory symptoms as outlined in HPI above.  His physical exam does reveal inflammation of his upper respiratory tract as evidenced by inflamed nasal mucosa with clear nasal discharge.  Also erythema and clear postnasal drip in the posterior oropharynx.  Cardiopulmonary exam reveals the lung sounds in all fields.  Given that he has had symptoms for the last 8 days and they continue to worsen I do feel a trial of antibiotics is warranted.  I will discharge her home on Augmentin  875 twice daily with food for 7 days.  I have advised him to continue using his Atrovent  nasal spray and we discussed performing sinus irrigation to help wash away nasal irritants which could be contributing to the congestion and drainage.  I will prescribe Tessalon  Perles and Promethazine  DM cough syrup for cough  and congestion.   Final Clinical Impressions(s) / UC Diagnoses   Final diagnoses:  URI with cough and congestion     Discharge Instructions      The Augmentin  twice daily with food for 7 days for treatment of your URI.  Perform sinus irrigation 2-3 times a day with a NeilMed sinus rinse kit and distilled water.  Do not use tap water.  You can use plain over-the-counter Mucinex every 6 hours to break up the stickiness of the mucus so your body can clear it.  Increase your oral fluid intake to thin out your mucus so that is also able for your body to clear more easily.  Take an over-the-counter probiotic, such as Culturelle-align-activia, 1 hour after each dose of antibiotic to prevent diarrhea.  Use the Atrovent  nasal spray, 2 squirts in each nostril every 6 hours, as needed for runny nose and postnasal drip.  Use the Tessalon  Perles every 8 hours during the day.  Take them with a small sip of water.  They may give you some numbness to the base of your tongue or a metallic taste in your mouth, this is normal.  Use the Promethazine  DM cough syrup at bedtime for cough and congestion.  It will make you drowsy so do not take it during the day.  Return for reevaluation or see your primary care provider for any new or worsening symptoms.      ED Prescriptions     Medication Sig Dispense Auth. Provider   amoxicillin -clavulanate (AUGMENTIN ) 875-125 MG tablet Take 1 tablet by mouth every 12 (twelve) hours for 7 days. 14 tablet Bernardino Ditch, NP   benzonatate  (TESSALON ) 100 MG capsule Take 2 capsules (200 mg total) by  mouth every 8 (eight) hours. 21 capsule Bernardino Ditch, NP   promethazine -dextromethorphan (PROMETHAZINE -DM) 6.25-15 MG/5ML syrup Take 5 mLs by mouth 4 (four) times daily as needed. 118 mL Bernardino Ditch, NP      PDMP not reviewed this encounter.   Bernardino Ditch, NP 06/11/24 [phone]

## 2024-08-06 ENCOUNTER — Encounter: Payer: Self-pay | Admitting: Emergency Medicine

## 2024-08-06 ENCOUNTER — Ambulatory Visit
Admission: EM | Admit: 2024-08-06 | Discharge: 2024-08-06 | Disposition: A | Discharge status: Home / Self Care | Attending: Family Medicine | Admitting: Family Medicine

## 2024-08-06 DIAGNOSIS — S0990XA Unspecified injury of head, initial encounter: Secondary | ICD-10-CM

## 2024-08-06 DIAGNOSIS — S0181XA Laceration without foreign body of other part of head, initial encounter: Secondary | ICD-10-CM

## 2024-08-06 NOTE — ED Provider Notes (Signed)
 [facility]    CSN: [id number] Arrival date & time: 08/06/24  9047      History   Chief Complaint Chief Complaint  Patient presents with   Head Injury   Head Laceration    HPI Logan Erickson is a 58 y.o. male.   HPI   Logan Erickson presents for traumatic head injury that occurred at home just prior to arrival.  Pt was underneath his car where a heavy alternator weighing 10-15 lbs dropped on his head. He has a forehead laceration that was bleeding quite a bit. He applied some pressure and the wound has stopped bleeding for the most part. Has head pain.  He is accompanied to the urgent care by his male family member.       Past Medical History:  Diagnosis Date   Depression    kidney stone    Migraine     There are no active problems to display for this patient.   Past Surgical History:  Procedure Laterality Date   EXTRACORPOREAL SHOCK WAVE LITHOTRIPSY Left 01/30/2022   Procedure: EXTRACORPOREAL SHOCK WAVE LITHOTRIPSY (ESWL);  Surgeon: Francisca Redell BROCKS, MD;  Location: [facility] OR[facility];  Service: Urology;  Laterality: Left;   reconsturctive surgery right thumb         Home Medications    Prior to Admission medications   Medication Sig Start Date End Date Taking? Authorizing Provider  cyclobenzaprine (FLEXERIL) 5 MG tablet Take 5 mg by mouth 3 (three) times daily as needed. 06/06/24  Yes [provider]  EMGALITY 120 MG/ML SOAJ  07/26/24  Yes [provider]  latanoprost (XALATAN) 0.005 % ophthalmic solution SMARTSIG:In Eye(s) 06/10/24  Yes [provider]  buPROPion (WELLBUTRIN XL) 150 MG 24 hr tablet Take 150 mg by mouth every morning. 09/02/21   [provider]  ibuprofen  (ADVIL ) 600 MG tablet Take 1 tablet (600 mg total) by mouth every 8 (eight) hours as needed. 06/05/24   Mortenson, Ashley, MD  LORazepam (ATIVAN) 0.5 MG tablet Take 0.5 mg by mouth 2 (two) times daily as needed. 11/15/21   [provider]  metoprolol  succinate (TOPROL-XL) 100 MG 24 hr tablet Take 1 tablet by mouth daily. 03/19/17   [provider]  venlafaxine (EFFEXOR) 75 MG tablet Take by mouth. 12/01/16   [provider]    Family History History reviewed. No pertinent family history.  Social History Social History   Tobacco Use   Smoking status: Former    Current packs/day: 0.00    Average packs/day: 0.3 packs/day for 1 year (0.3 ttl pk-yrs)    Types: Cigarettes    Start date: 02/09/1988    Quit date: 02/08/1989    Years since quitting: 35.5    Passive exposure: Past   Smokeless tobacco: Never  Vaping Use   Vaping status: Never Used  Substance Use Topics   Alcohol use: Yes   Drug use: No     Allergies   Ciprofloxacin  and Erythromycin   Review of Systems Review of Systems: negative unless otherwise stated in HPI.      Physical Exam Triage Vital Signs ED Triage Vitals  Encounter Vitals Group     BP      Girls Systolic BP Percentile      Girls Diastolic BP Percentile      Boys Systolic BP Percentile      Boys Diastolic BP Percentile      Pulse      Resp  Temp      Temp src      SpO2      Weight      Height      Head Circumference      Peak Flow      Pain Score      Pain Loc      Pain Education      Exclude from Growth Chart    No data found.  Updated Vital Signs BP (!) 167/95 (BP Location: Right Arm)   Pulse 67   Temp 97.8 F (36.6 C) (Oral)   Ht 5' 11 (1.803 m)   Wt 117.9 kg   SpO2 99%   BMI 36.25 kg/m   Visual Acuity Right Eye Distance:   Left Eye Distance:   Bilateral Distance:    Right Eye Near:   Left Eye Near:    Bilateral Near:     Physical Exam GEN: Alert, male in no acute distress  EYES: Extraocular movements intact, pupils equal round and reactive to light HENT: Moist mucous membranes, no oropharyngeal lesions, no blood visble, 3 cm off-center forehead laceration with minimal bleeding, erythema and ecchymosis around the laceration  RESP: no  increased work of breathing MSK: No extremity edema or deformities SKIN: warm, dry, see HENT above  NEURO: alert, moves all extremities appropriately, strength 5/5 bilateral upper and lower extremities, alert and oriented, normal speech     UC Treatments / Results  Labs (all labs ordered are listed, but only abnormal results are displayed) Labs Reviewed - No data to display  EKG   Radiology No results found.  Procedures Procedures (including critical care time)  Medications Ordered in UC Medications - No data to display  Initial Impression / Assessment and Plan / UC Course  I have reviewed the triage vital signs and the nursing notes.  Pertinent labs & imaging results that were available during my care of the patient were reviewed by me and considered in my medical decision making (see chart for details).       Patient is a 58 y.o. male who presents for head injury that occurred just prior to arrival. He has a forehead laceration with surrounding ecchymosis after a heavy metal piece fell onto his forehead while he was underneath the car. Neurological exam is unremarkable.  Suspect he may need a head CT to unsure no skull fracture or intracranial bleeding.  Recommended ED evaluation and he is agreeable. He doesn't want the bill associated with the ED but he is willing to go to [facility] for further evaluation.     Discussed MDM, treatment plan and plan for follow-up with patient who agrees with plan.        Final Clinical Impressions(s) / UC Diagnoses   Final diagnoses:  Head trauma, initial encounter  Forehead laceration, initial encounter     Discharge Instructions      You have been advised to follow up immediately in the emergency department for concerning signs or symptoms as discussed during your visit. If you declined EMS transport, please have a family member take you directly to the ED at this time. Do not delay.   Based on concerns about  condition, if you do not follow up in the ED, you may risk poor outcomes including worsening of condition, delayed treatment and potentially life threatening issues. If you have declined to go to the ED at this time, you should call your PCP immediately to set up a follow up appointment.  Go to ED for red flag symptoms, including; fevers you cannot reduce with Tylenol /Motrin , severe headaches, vision changes, numbness/weakness in part of the body, lethargy, confusion, intractable vomiting, severe dehydration, chest pain, breathing difficulty, severe persistent abdominal or pelvic pain, signs of severe infection (increased redness, swelling of an area), feeling faint or passing out, dizziness, etc. You should especially go to the ED for sudden acute worsening of condition if you do not elect to go at this time.       ED Prescriptions   None    PDMP not reviewed this encounter.   Marquavius Scaife, DO 08/06/24 1019

## 2024-08-06 NOTE — ED Triage Notes (Signed)
 Patient was installing an alternater from under his car and while he was holding it it fell at hit him in the forehead today about 20 min ago.  Patient denies LOC.  Patient has laceration to his forehead with no bleeding at this time.  Patient reports tenderness at the site.

## 2024-08-06 NOTE — ED Notes (Signed)
 Patient is being discharged from the Urgent Care and sent to the [facility] Emergency Department via private vehicle with wife . Per Dr. Kriste, patient is in need of higher level of care due to Head Injury with laceration to forehead. Patient is aware and verbalizes understanding of plan of care.  Vitals:   08/06/24 1002  BP: (!) 167/95  Pulse: 67  Temp: 97.8 F (36.6 C)  SpO2: 99%

## 2024-08-06 NOTE — Discharge Instructions (Signed)
